# Patient Record
Sex: Male | Born: 1985 | Race: White | Hispanic: No | Marital: Married | Smoking: Current every day smoker
Health system: Southern US, Community
[De-identification: ages and names within clinical notes are randomized; demographics above are authoritative.]

## PROBLEM LIST (undated history)

## (undated) DIAGNOSIS — R569 Unspecified convulsions: Secondary | ICD-10-CM

## (undated) DIAGNOSIS — S02609A Fracture of mandible, unspecified, initial encounter for closed fracture: Secondary | ICD-10-CM

## (undated) DIAGNOSIS — K56609 Unspecified intestinal obstruction, unspecified as to partial versus complete obstruction: Secondary | ICD-10-CM

## (undated) HISTORY — PX: MANDIBLE FRACTURE SURGERY: SHX706

---

## 2003-07-24 ENCOUNTER — Other Ambulatory Visit: Payer: Self-pay

## 2003-10-26 ENCOUNTER — Other Ambulatory Visit: Payer: Self-pay

## 2004-10-30 ENCOUNTER — Emergency Department: Payer: Self-pay | Admitting: Emergency Medicine

## 2005-05-10 ENCOUNTER — Emergency Department: Payer: Self-pay | Admitting: Emergency Medicine

## 2005-05-17 ENCOUNTER — Emergency Department: Payer: Self-pay | Admitting: Unknown Physician Specialty

## 2005-06-07 ENCOUNTER — Emergency Department: Payer: Self-pay | Admitting: Emergency Medicine

## 2006-03-16 ENCOUNTER — Emergency Department: Payer: Self-pay | Admitting: Emergency Medicine

## 2006-03-16 ENCOUNTER — Other Ambulatory Visit: Payer: Self-pay

## 2006-06-27 ENCOUNTER — Emergency Department: Payer: Self-pay | Admitting: Unknown Physician Specialty

## 2006-07-14 ENCOUNTER — Emergency Department: Payer: Self-pay | Admitting: Emergency Medicine

## 2006-09-09 ENCOUNTER — Emergency Department: Payer: Self-pay | Admitting: Emergency Medicine

## 2006-09-21 ENCOUNTER — Emergency Department: Payer: Self-pay | Admitting: Emergency Medicine

## 2006-09-30 ENCOUNTER — Emergency Department: Payer: Self-pay | Admitting: Emergency Medicine

## 2006-10-25 ENCOUNTER — Emergency Department: Payer: Self-pay | Admitting: Emergency Medicine

## 2007-01-23 ENCOUNTER — Emergency Department: Payer: Self-pay | Admitting: Emergency Medicine

## 2007-01-26 ENCOUNTER — Emergency Department: Payer: Self-pay | Admitting: Emergency Medicine

## 2008-01-05 ENCOUNTER — Emergency Department: Payer: Self-pay | Admitting: Emergency Medicine

## 2008-03-14 ENCOUNTER — Emergency Department: Payer: Self-pay | Admitting: Emergency Medicine

## 2008-05-17 ENCOUNTER — Emergency Department: Payer: Self-pay | Admitting: Emergency Medicine

## 2008-06-19 ENCOUNTER — Emergency Department: Payer: Self-pay | Admitting: Emergency Medicine

## 2008-08-21 ENCOUNTER — Emergency Department: Payer: Self-pay | Admitting: Internal Medicine

## 2008-10-08 ENCOUNTER — Emergency Department: Payer: Self-pay | Admitting: Emergency Medicine

## 2009-01-19 ENCOUNTER — Inpatient Hospital Stay: Payer: Self-pay | Admitting: *Deleted

## 2009-05-15 ENCOUNTER — Emergency Department: Payer: Self-pay | Admitting: Emergency Medicine

## 2009-06-15 ENCOUNTER — Emergency Department: Payer: Self-pay | Admitting: Emergency Medicine

## 2009-07-14 ENCOUNTER — Emergency Department: Payer: Self-pay | Admitting: Emergency Medicine

## 2009-10-13 ENCOUNTER — Emergency Department: Payer: Self-pay | Admitting: Emergency Medicine

## 2010-06-04 ENCOUNTER — Emergency Department: Payer: Self-pay | Admitting: Emergency Medicine

## 2010-06-14 ENCOUNTER — Emergency Department: Payer: Self-pay | Admitting: Emergency Medicine

## 2010-07-20 ENCOUNTER — Emergency Department: Payer: Self-pay | Admitting: Internal Medicine

## 2010-07-22 ENCOUNTER — Emergency Department: Payer: Self-pay | Admitting: Emergency Medicine

## 2010-12-07 ENCOUNTER — Emergency Department: Payer: Self-pay | Admitting: Emergency Medicine

## 2010-12-19 ENCOUNTER — Emergency Department: Payer: Self-pay | Admitting: Emergency Medicine

## 2011-02-27 ENCOUNTER — Emergency Department: Payer: Self-pay | Admitting: Emergency Medicine

## 2011-02-27 LAB — RAPID INFLUENZA A&B ANTIGENS

## 2011-03-01 ENCOUNTER — Emergency Department: Payer: Self-pay | Admitting: Emergency Medicine

## 2011-03-27 IMAGING — CR CERVICAL SPINE - COMPLETE 4+ VIEW
1 series · 7 of 7 positions shown · non-contrast
Comparison: None

REASON FOR EXAM: pain
COMMENTS:   May transport without cardiac monitor

PROCEDURE:     DXR - DXR CERVICAL SPINE COMPLETE  - July 14, 2009  [DATE]
RESULT:     History: Pain

[Series 1: view not recorded · 0.17mm/px · 7 of 7 slices shown]
[im 1/7]
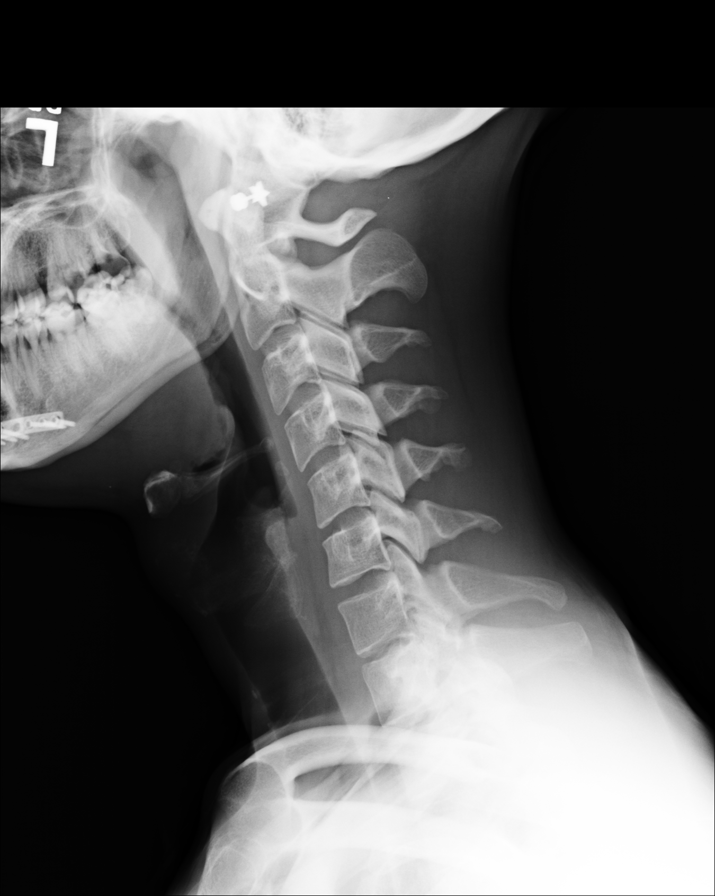
[im 2/7]
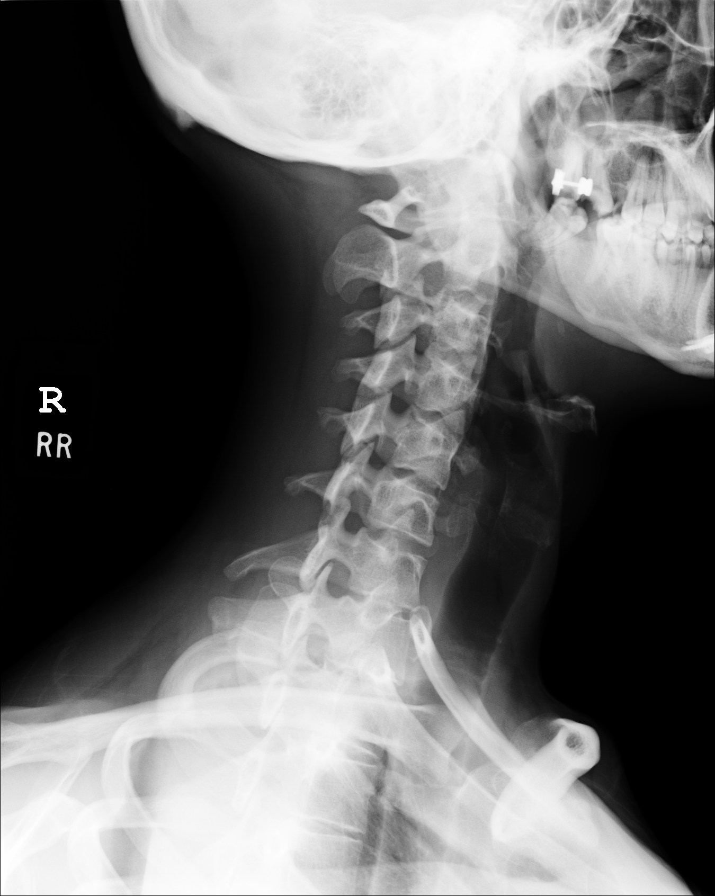
[im 3/7]
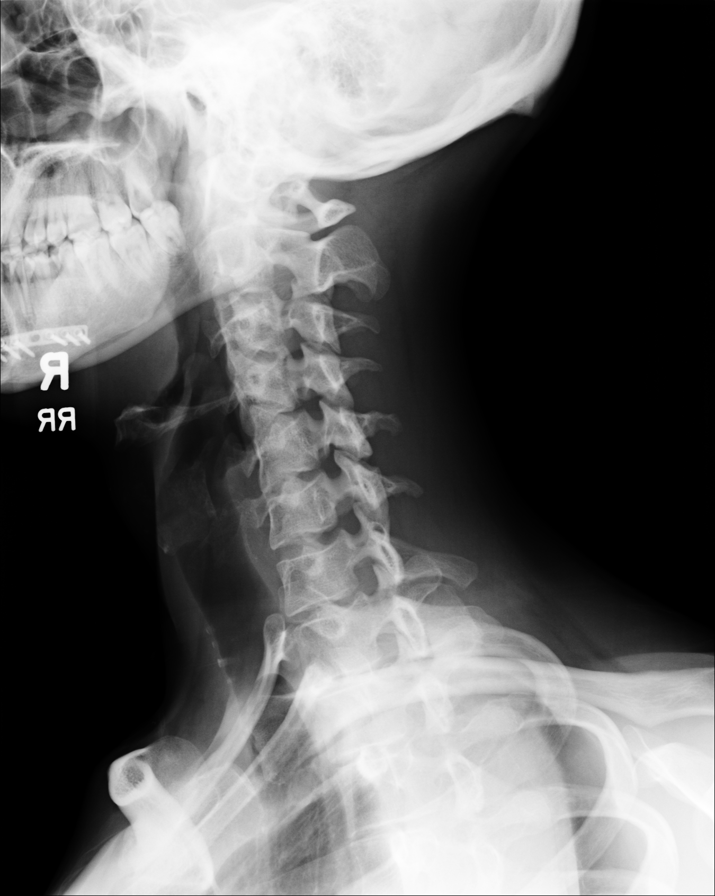
[im 4/7]
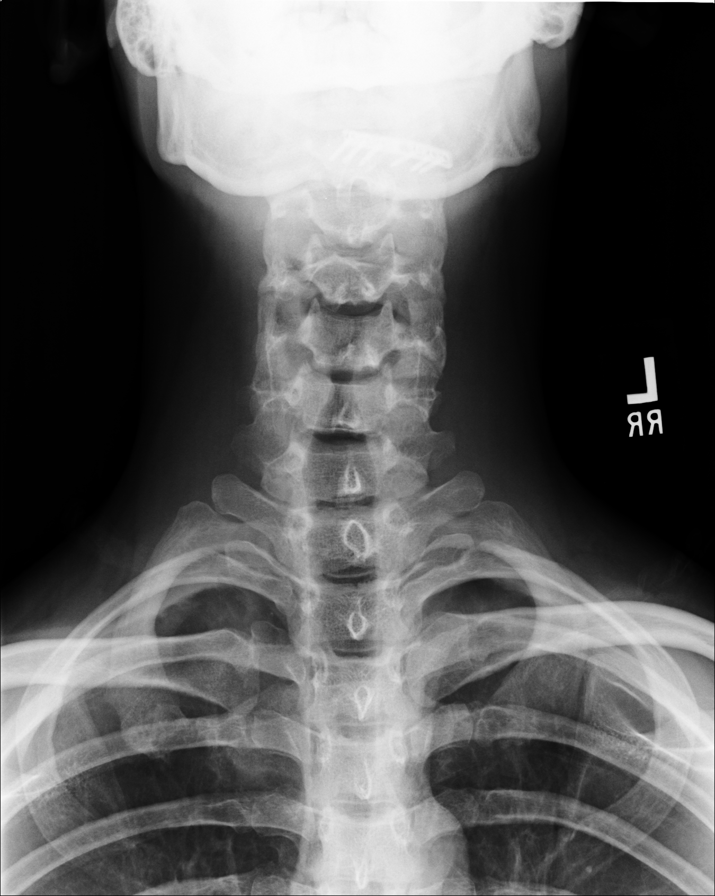
[im 5/7]
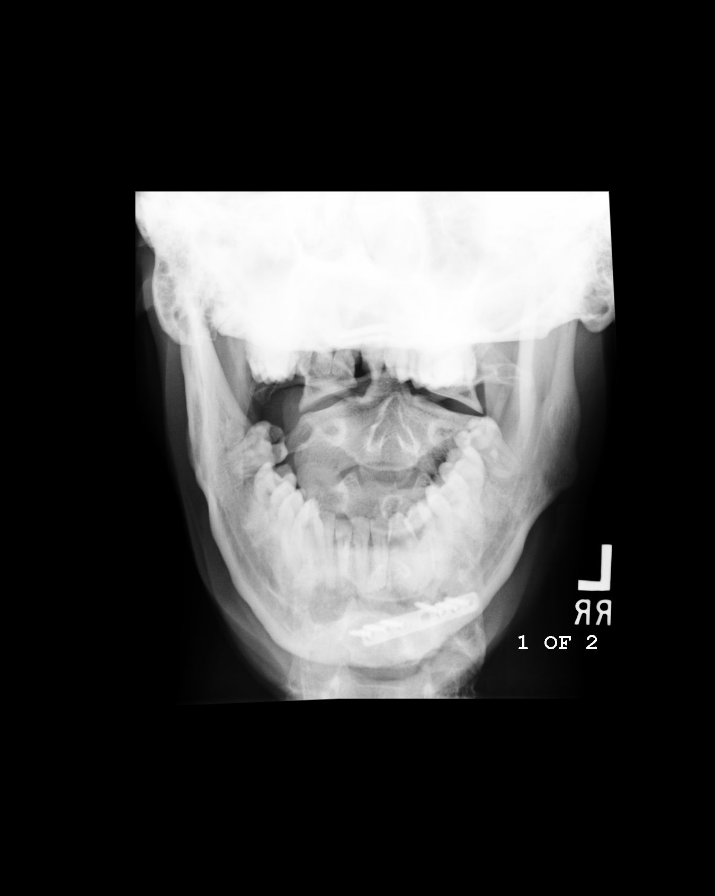
[im 6/7]
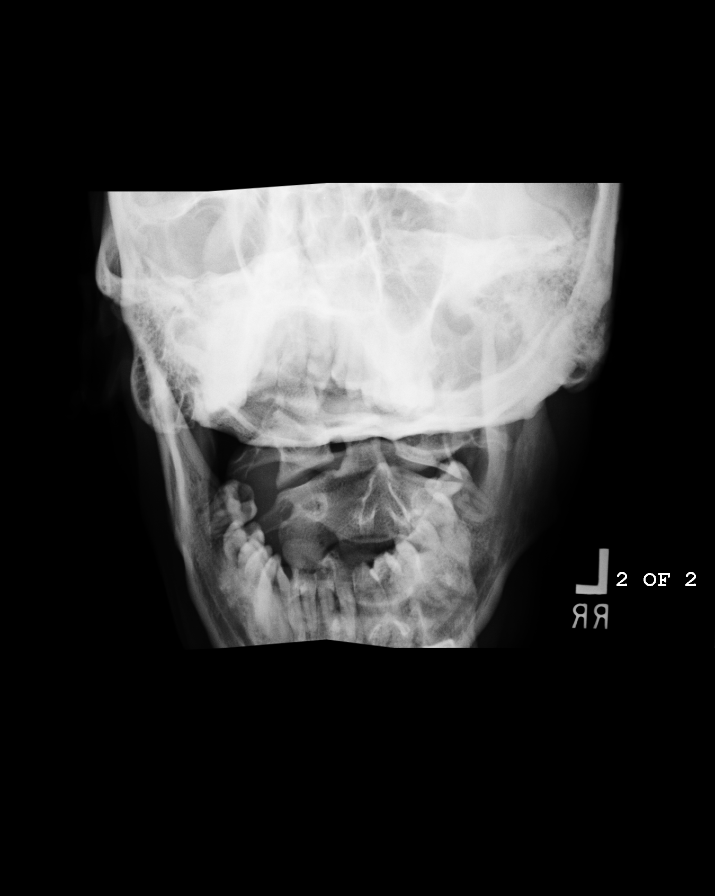
[im 7/7]
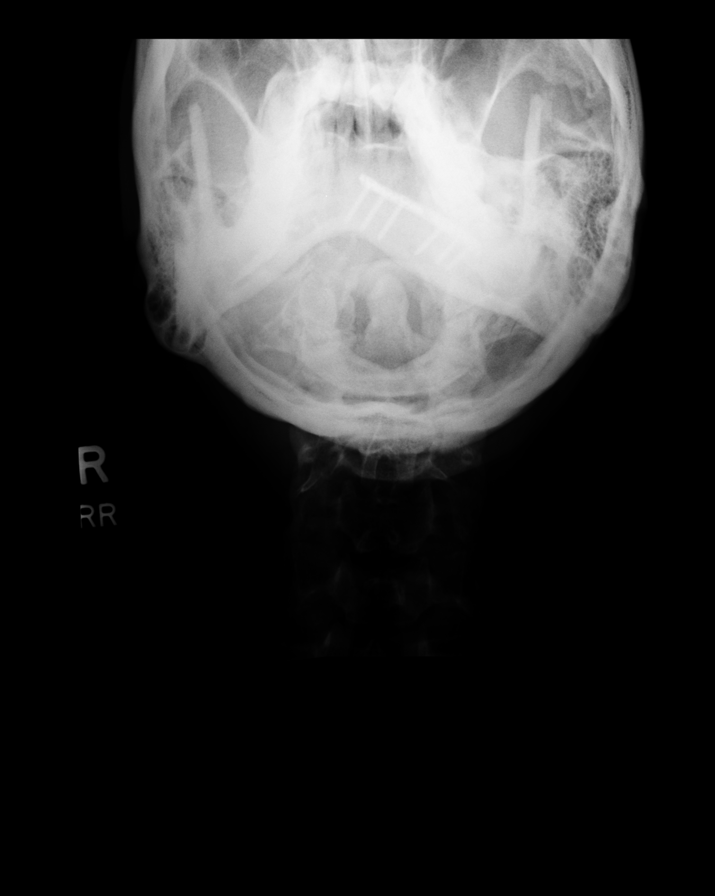

[7 of 7 positions shown; findings below may reference images not displayed]

FINDINGS: AP, lateral, bilateral oblique and odontoid views of the cervical spine are
provided.

The cervical spine is visualized to the level of C7-T1.

The vertebral body heights are maintained. The alignment is normal. The
prevertebral soft tissues are normal. There is no acute fracture or static
listhesis. Bilateral neural foramina are patent.
IMPRESSION: No acute osseous injury of the cervical spine.

## 2011-06-14 ENCOUNTER — Emergency Department: Payer: Self-pay | Admitting: Emergency Medicine

## 2011-12-11 ENCOUNTER — Emergency Department: Payer: Self-pay | Admitting: Emergency Medicine

## 2011-12-19 ENCOUNTER — Emergency Department: Payer: Self-pay | Admitting: Internal Medicine

## 2012-01-21 ENCOUNTER — Emergency Department: Payer: Self-pay | Admitting: Emergency Medicine

## 2012-02-09 ENCOUNTER — Emergency Department: Payer: Self-pay | Admitting: Emergency Medicine

## 2012-02-09 LAB — URINALYSIS, COMPLETE
Ketone: NEGATIVE
Leukocyte Esterase: NEGATIVE
Nitrite: NEGATIVE
Ph: 6 (ref 4.5–8.0)
Protein: NEGATIVE
RBC,UR: 127 /HPF (ref 0–5)
WBC UR: 1 /HPF (ref 0–5)

## 2012-02-09 LAB — COMPREHENSIVE METABOLIC PANEL
Albumin: 3.8 g/dL (ref 3.4–5.0)
Alkaline Phosphatase: 71 U/L (ref 50–136)
Bilirubin,Total: 0.4 mg/dL (ref 0.2–1.0)
Calcium, Total: 8.7 mg/dL (ref 8.5–10.1)
Co2: 26 mmol/L (ref 21–32)
EGFR (Non-African Amer.): >60
Potassium: 4.3 mmol/L (ref 3.5–5.1)
SGOT(AST): 22 U/L (ref 15–37)
SGPT (ALT): 27 U/L (ref 12–78)
Sodium: 140 mmol/L (ref 136–145)
Total Protein: 7 g/dL (ref 6.4–8.2)

## 2012-02-09 LAB — CBC
HCT: 40.2 % (ref 40.0–52.0)
HGB: 13.7 g/dL (ref 13.0–18.0)
MCH: 31.3 pg (ref 26.0–34.0)
MCHC: 34.2 g/dL (ref 32.0–36.0)
MCV: 92 fL (ref 80–100)
Platelet: 253 10*3/uL (ref 150–440)
RBC: 4.39 10*6/uL — ABNORMAL LOW (ref 4.40–5.90)

## 2012-06-19 ENCOUNTER — Emergency Department: Payer: Self-pay | Admitting: Emergency Medicine

## 2012-08-22 ENCOUNTER — Emergency Department: Payer: Self-pay | Admitting: Emergency Medicine

## 2012-09-11 ENCOUNTER — Emergency Department: Payer: Self-pay | Admitting: Internal Medicine

## 2012-11-16 ENCOUNTER — Emergency Department: Payer: Self-pay | Admitting: Internal Medicine

## 2012-12-06 ENCOUNTER — Emergency Department: Payer: Self-pay | Admitting: Emergency Medicine

## 2012-12-06 LAB — URINALYSIS, COMPLETE
Bacteria: NONE SEEN
Blood: NEGATIVE
Glucose,UR: NEGATIVE mg/dL (ref 0–75)
Nitrite: NEGATIVE
Ph: 8 (ref 4.5–8.0)
Specific Gravity: 1.018 (ref 1.003–1.030)
Squamous Epithelial: <1
WBC UR: <1 /HPF (ref 0–5)

## 2013-09-05 ENCOUNTER — Emergency Department: Payer: Self-pay | Admitting: Emergency Medicine

## 2013-09-15 ENCOUNTER — Ambulatory Visit: Payer: Self-pay | Admitting: Surgery

## 2013-09-22 ENCOUNTER — Ambulatory Visit: Payer: Self-pay | Admitting: Surgery

## 2013-09-22 LAB — DRUG SCREEN, URINE
AMPHETAMINES, UR SCREEN: NEGATIVE (ref ?–1000)
BARBITURATES, UR SCREEN: NEGATIVE (ref ?–200)
Benzodiazepine, Ur Scrn: NEGATIVE (ref ?–200)
Cannabinoid 50 Ng, Ur ~~LOC~~: POSITIVE (ref ?–50)
Cocaine Metabolite,Ur ~~LOC~~: POSITIVE (ref ?–300)
MDMA (Ecstasy)Ur Screen: NEGATIVE (ref ?–500)
Methadone, Ur Screen: NEGATIVE (ref ?–300)
Opiate, Ur Screen: POSITIVE (ref ?–300)
Phencyclidine (PCP) Ur S: NEGATIVE (ref ?–25)
Tricyclic, Ur Screen: NEGATIVE (ref ?–1000)

## 2014-01-20 ENCOUNTER — Emergency Department: Payer: Self-pay | Admitting: Student

## 2014-02-02 ENCOUNTER — Emergency Department: Payer: Self-pay | Admitting: Student

## 2014-02-22 LAB — COMPREHENSIVE METABOLIC PANEL
Albumin: 3.9 g/dL (ref 3.4–5.0)
Alkaline Phosphatase: 54 U/L
Anion Gap: 6 — ABNORMAL LOW (ref 7–16)
BILIRUBIN TOTAL: 0.3 mg/dL (ref 0.2–1.0)
BUN: 13 mg/dL (ref 7–18)
Calcium, Total: 8.9 mg/dL (ref 8.5–10.1)
Chloride: 109 mmol/L — ABNORMAL HIGH (ref 98–107)
Co2: 24 mmol/L (ref 21–32)
Creatinine: 1.11 mg/dL (ref 0.60–1.30)
EGFR (African American): >60
Glucose: 97 mg/dL (ref 65–99)
OSMOLALITY: 278 (ref 275–301)
Potassium: 3.9 mmol/L (ref 3.5–5.1)
SGOT(AST): 19 U/L (ref 15–37)
SGPT (ALT): 25 U/L
Sodium: 139 mmol/L (ref 136–145)
Total Protein: 6.9 g/dL (ref 6.4–8.2)

## 2014-02-22 LAB — DRUG SCREEN, URINE
Amphetamines, Ur Screen: NEGATIVE (ref ?–1000)
BARBITURATES, UR SCREEN: NEGATIVE (ref ?–200)
BENZODIAZEPINE, UR SCRN: POSITIVE (ref ?–200)
Cannabinoid 50 Ng, Ur ~~LOC~~: POSITIVE (ref ?–50)
Cocaine Metabolite,Ur ~~LOC~~: POSITIVE (ref ?–300)
MDMA (Ecstasy)Ur Screen: NEGATIVE (ref ?–500)
Methadone, Ur Screen: NEGATIVE (ref ?–300)
Opiate, Ur Screen: POSITIVE (ref ?–300)
Phencyclidine (PCP) Ur S: NEGATIVE (ref ?–25)
Tricyclic, Ur Screen: NEGATIVE (ref ?–1000)

## 2014-02-22 LAB — URINALYSIS, COMPLETE
BACTERIA: NONE SEEN
BLOOD: NEGATIVE
Bilirubin,UR: NEGATIVE
Glucose,UR: NEGATIVE mg/dL (ref 0–75)
KETONE: NEGATIVE
LEUKOCYTE ESTERASE: NEGATIVE
NITRITE: NEGATIVE
PH: 5 (ref 4.5–8.0)
Protein: 30
RBC,UR: <1 /HPF (ref 0–5)
Specific Gravity: 1.02 (ref 1.003–1.030)
Squamous Epithelial: NONE SEEN
WBC UR: <1 /HPF (ref 0–5)

## 2014-02-22 LAB — CBC
HCT: 42 % (ref 40.0–52.0)
HGB: 13.8 g/dL (ref 13.0–18.0)
MCH: 30.4 pg (ref 26.0–34.0)
MCHC: 32.8 g/dL (ref 32.0–36.0)
MCV: 93 fL (ref 80–100)
Platelet: 258 10*3/uL (ref 150–440)
RBC: 4.54 10*6/uL (ref 4.40–5.90)
RDW: 13.5 % (ref 11.5–14.5)
WBC: 8 10*3/uL (ref 3.8–10.6)

## 2014-02-22 LAB — SALICYLATE LEVEL: Salicylates, Serum: 4.1 mg/dL — ABNORMAL HIGH

## 2014-02-22 LAB — ACETAMINOPHEN LEVEL

## 2014-02-22 LAB — ETHANOL

## 2014-02-23 ENCOUNTER — Inpatient Hospital Stay: Payer: Self-pay | Admitting: Psychiatry

## 2014-06-13 ENCOUNTER — Emergency Department
Admit: 2014-06-13 | Disposition: A | Discharge status: Home / Self Care | Payer: Self-pay | Admitting: Emergency Medicine

## 2014-06-17 NOTE — Consult Note (Signed)
Brief Consult Note: Diagnosis: major depression.   Patient was seen by consultant.   Consult note dictated.   Orders entered.   Comments: Psychiatry: PAtient reports severe depression and suicidal ideation. Multi drug abuse . [facility] PRN detox meds.  Electronic Signatures: Clapacs, Jackquline DenmarkJohn T (MD)  (Signed 30-Dec-15 17:53)  Authored: Brief Consult Note   Last Updated: 30-Dec-15 17:53 by Audery Amellapacs, John T (MD)
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
## 2014-07-27 ENCOUNTER — Emergency Department: Payer: Medicaid Other

## 2014-07-27 ENCOUNTER — Emergency Department
Admission: EM | Admit: 2014-07-27 | Discharge: 2014-07-27 | Disposition: A | Discharge status: Home / Self Care | Payer: Medicaid Other | Attending: Emergency Medicine | Admitting: Emergency Medicine

## 2014-07-27 DIAGNOSIS — Y998 Other external cause status: Secondary | ICD-10-CM | POA: Diagnosis not present

## 2014-07-27 DIAGNOSIS — S0181XA Laceration without foreign body of other part of head, initial encounter: Secondary | ICD-10-CM | POA: Insufficient documentation

## 2014-07-27 DIAGNOSIS — W1809XA Striking against other object with subsequent fall, initial encounter: Secondary | ICD-10-CM | POA: Diagnosis not present

## 2014-07-27 DIAGNOSIS — S0083XA Contusion of other part of head, initial encounter: Secondary | ICD-10-CM

## 2014-07-27 DIAGNOSIS — Y9289 Other specified places as the place of occurrence of the external cause: Secondary | ICD-10-CM | POA: Diagnosis not present

## 2014-07-27 DIAGNOSIS — Y9389 Activity, other specified: Secondary | ICD-10-CM | POA: Insufficient documentation

## 2014-07-27 DIAGNOSIS — S0993XA Unspecified injury of face, initial encounter: Secondary | ICD-10-CM | POA: Diagnosis present

## 2014-07-27 MED ORDER — SULFAMETHOXAZOLE-TRIMETHOPRIM 800-160 MG PO TABS
ORAL_TABLET | ORAL | Status: AC
Start: 2014-07-27 — End: 2014-07-27
  Administered 2014-07-27: 1 via ORAL
  Filled 2014-07-27: qty 1

## 2014-07-27 MED ORDER — IBUPROFEN 800 MG PO TABS: 800.0000 mg | ORAL_TABLET | Freq: Three times a day (TID) | ORAL | Status: DC | PRN

## 2014-07-27 MED ORDER — LIDOCAINE HCL (PF) 1 % IJ SOLN
INTRAMUSCULAR | Status: AC
  Filled 2014-07-27: qty 5

## 2014-07-27 MED ORDER — SULFAMETHOXAZOLE-TRIMETHOPRIM 800-160 MG PO TABS: 1.0000 | ORAL_TABLET | Freq: Two times a day (BID) | ORAL | Status: DC

## 2014-07-27 MED ORDER — HYDROMORPHONE HCL 1 MG/ML IJ SOLN
1.0000 mg | Freq: Once | INTRAMUSCULAR | Status: AC
  Administered 2014-07-27: 1 mg via INTRAMUSCULAR

## 2014-07-27 MED ORDER — SULFAMETHOXAZOLE-TRIMETHOPRIM 800-160 MG PO TABS
1.0000 | ORAL_TABLET | Freq: Once | ORAL | Status: AC
  Administered 2014-07-27: 1 via ORAL

## 2014-07-27 MED ORDER — TRAMADOL HCL 50 MG PO TABS: 50.0000 mg | ORAL_TABLET | Freq: Four times a day (QID) | ORAL | Status: DC | PRN

## 2014-07-27 MED ORDER — HYDROMORPHONE HCL 1 MG/ML IJ SOLN
INTRAMUSCULAR | Status: AC
  Administered 2014-07-27: 1 mg via INTRAMUSCULAR
  Filled 2014-07-27: qty 1
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
## 2015-04-01 ENCOUNTER — Encounter: Payer: Self-pay | Admitting: Emergency Medicine

## 2015-04-01 ENCOUNTER — Emergency Department
Admission: EM | Admit: 2015-04-01 | Discharge: 2015-04-01 | Disposition: A | Discharge status: Home / Self Care | Payer: Medicaid Other | Attending: Emergency Medicine | Admitting: Emergency Medicine

## 2015-04-01 DIAGNOSIS — F1210 Cannabis abuse, uncomplicated: Secondary | ICD-10-CM | POA: Diagnosis not present

## 2015-04-01 DIAGNOSIS — K4090 Unilateral inguinal hernia, without obstruction or gangrene, not specified as recurrent: Secondary | ICD-10-CM | POA: Insufficient documentation

## 2015-04-01 DIAGNOSIS — F17210 Nicotine dependence, cigarettes, uncomplicated: Secondary | ICD-10-CM | POA: Diagnosis not present

## 2015-04-01 DIAGNOSIS — F1410 Cocaine abuse, uncomplicated: Secondary | ICD-10-CM | POA: Insufficient documentation

## 2015-04-01 DIAGNOSIS — F1110 Opioid abuse, uncomplicated: Secondary | ICD-10-CM | POA: Insufficient documentation

## 2015-04-01 DIAGNOSIS — Z792 Long term (current) use of antibiotics: Secondary | ICD-10-CM | POA: Diagnosis not present

## 2015-04-01 DIAGNOSIS — F419 Anxiety disorder, unspecified: Secondary | ICD-10-CM | POA: Diagnosis not present

## 2015-04-01 HISTORY — DX: Unspecified intestinal obstruction, unspecified as to partial versus complete obstruction: K56.609

## 2015-04-01 HISTORY — DX: Unspecified convulsions: R56.9

## 2015-04-01 HISTORY — DX: Fracture of mandible, unspecified, initial encounter for closed fracture: S02.609A

## 2015-04-01 LAB — URINALYSIS COMPLETE WITH MICROSCOPIC (ARMC ONLY)
BILIRUBIN URINE: NEGATIVE
Glucose, UA: NEGATIVE mg/dL
HGB URINE DIPSTICK: NEGATIVE
Ketones, ur: NEGATIVE mg/dL
LEUKOCYTES UA: NEGATIVE
Nitrite: NEGATIVE
Protein, ur: NEGATIVE mg/dL
SPECIFIC GRAVITY, URINE: 1.014 (ref 1.005–1.030)
Squamous Epithelial / LPF: NONE SEEN
pH: 8 (ref 5.0–8.0)

## 2015-04-01 LAB — COMPREHENSIVE METABOLIC PANEL
ALBUMIN: 3.3 g/dL — AB (ref 3.5–5.0)
ALK PHOS: 35 U/L — AB (ref 38–126)
ALT: 19 U/L (ref 17–63)
ANION GAP: 8 (ref 5–15)
AST: 21 U/L (ref 15–41)
BILIRUBIN TOTAL: 0.3 mg/dL (ref 0.3–1.2)
BUN: 18 mg/dL (ref 6–20)
CALCIUM: 8.4 mg/dL — AB (ref 8.9–10.3)
CO2: 23 mmol/L (ref 22–32)
CREATININE: 1.43 mg/dL — AB (ref 0.61–1.24)
Chloride: 107 mmol/L (ref 101–111)
GFR calc non Af Amer: >60 mL/min (ref >60)
GLUCOSE: 92 mg/dL (ref 65–99)
Potassium: 4.3 mmol/L (ref 3.5–5.1)
Sodium: 138 mmol/L (ref 135–145)
Total Protein: 5.6 g/dL — ABNORMAL LOW (ref 6.5–8.1)

## 2015-04-01 LAB — CBC WITH DIFFERENTIAL/PLATELET
BASOS PCT: 1 %
Basophils Absolute: 0 10*3/uL (ref 0–0.1)
EOS ABS: 0.3 10*3/uL (ref 0–0.7)
EOS PCT: 4 %
HCT: 40.9 % (ref 40.0–52.0)
HEMOGLOBIN: 13.3 g/dL (ref 13.0–18.0)
LYMPHS ABS: 2 10*3/uL (ref 1.0–3.6)
Lymphocytes Relative: 27 %
MCH: 29.9 pg (ref 26.0–34.0)
MCHC: 32.6 g/dL (ref 32.0–36.0)
MCV: 91.7 fL (ref 80.0–100.0)
Monocytes Absolute: 0.7 10*3/uL (ref 0.2–1.0)
Monocytes Relative: 9 %
NEUTROS PCT: 59 %
Neutro Abs: 4.6 10*3/uL (ref 1.4–6.5)
PLATELETS: 224 10*3/uL (ref 150–440)
RBC: 4.46 MIL/uL (ref 4.40–5.90)
RDW: 13.7 % (ref 11.5–14.5)
WBC: 7.6 10*3/uL (ref 3.8–10.6)

## 2015-04-01 LAB — URINE DRUG SCREEN, QUALITATIVE (ARMC ONLY)
Amphetamines, Ur Screen: NOT DETECTED
BENZODIAZEPINE, UR SCRN: NOT DETECTED
Barbiturates, Ur Screen: NOT DETECTED
CANNABINOID 50 NG, UR ~~LOC~~: POSITIVE — AB
Cocaine Metabolite,Ur ~~LOC~~: POSITIVE — AB
MDMA (Ecstasy)Ur Screen: NOT DETECTED
METHADONE SCREEN, URINE: NOT DETECTED
Opiate, Ur Screen: POSITIVE — AB
Phencyclidine (PCP) Ur S: NOT DETECTED
TRICYCLIC, UR SCREEN: NOT DETECTED

## 2015-04-01 MED ORDER — DIPHENHYDRAMINE HCL 50 MG/ML IJ SOLN
12.5000 mg | Freq: Once | INTRAMUSCULAR | Status: AC
  Administered 2015-04-01: 12.5 mg via INTRAVENOUS

## 2015-04-01 MED ORDER — DIPHENHYDRAMINE HCL 50 MG/ML IJ SOLN
INTRAMUSCULAR | Status: AC
  Filled 2015-04-01: qty 1

## 2015-04-01 MED ORDER — MORPHINE SULFATE (PF) 4 MG/ML IV SOLN
4.0000 mg | Freq: Once | INTRAVENOUS | Status: AC
  Administered 2015-04-01: 4 mg via INTRAVENOUS
  Filled 2015-04-01: qty 1

## 2015-04-01 MED ORDER — DOCUSATE SODIUM 100 MG PO CAPS: 100.0000 mg | ORAL_CAPSULE | Freq: Every day | ORAL | Status: AC | PRN

## 2015-04-01 NOTE — ED Notes (Signed)
Pt states he has had an inguinal hernia on the R side. Pt states for the last 2 months the hernia has been hurting him, and the last 3 weeks pain has gotten worse. Pt states pain has gone to his testicles and under his buttocks and up into his back.

## 2015-04-01 NOTE — ED Provider Notes (Addendum)
[facility] Emergency Department Provider Note  ____________________________________________   I have reviewed the triage vital signs and the nursing notes.   HISTORY  Chief Complaint Inguinal Hernia    HPI Dakota Clements is a 30 y.o. male presents today complaining of his inguinal hernia hurting. Patient has had this hernia he states for 2-3 years at least. He has been seen by multiple doctors for including a urologist. States when he stands up it comes out and it hurts but then when he lies down because back in. He is able to make that happen at this moment. Does not appear to be incarcerated. 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 He has not had surgery on it he states. At this time, he is complaining of pain from the hernia seems to reflect down towards his testicles. This has happened to her multiple times before and has happened actually on a daily basis for 2-3 years. He has had no difficulty stooling no nausea no vomiting no melena bright red blood per rectum no dysuria no urinary frequency  Past Medical History  Diagnosis Date  . Small bowel obstruction (HCC)     When he was a baby  . Broken jaw (HCC)   . Seizures (HCC)     There are no active problems to display for this patient.   Past Surgical History  Procedure Laterality Date  . Mandible fracture surgery      Current Outpatient Rx  Name  Route  Sig  Dispense  Refill  . ibuprofen (ADVIL,MOTRIN) 800 MG tablet   Oral   Take 1 tablet (800 mg total) by mouth every 8 (eight) hours as needed for moderate pain.   15 tablet   0   . sulfamethoxazole-trimethoprim (BACTRIM DS,SEPTRA DS) 800-160 MG per tablet   Oral   Take 1 tablet by mouth 2 (two) times daily.   20 tablet   0   . traMADol (ULTRAM) 50 MG tablet   Oral   Take 1 tablet (50 mg total) by mouth every 6 (six) hours as needed for moderate pain.    12 tablet   0     Allergies Dilantin  History reviewed. No pertinent family history.  Social History Social History  Substance Use Topics  . Smoking status: Current Every Day Smoker -- 0.50 packs/day    Types: Cigarettes  . Smokeless tobacco: Never Used  . Alcohol Use: Yes     Comment: Socially    Review of Systems Constitutional: No fever/chills Eyes: No visual changes. ENT: No sore throat. No stiff neck no neck pain Cardiovascular: Denies chest pain. Respiratory: Denies shortness of breath. Gastrointestinal:   no vomiting.  No diarrhea.  No constipation. Genitourinary: Negative for dysuria. Musculoskeletal: Negative lower extremity swelling Skin: Negative for rash. Neurological: Negative for headaches, focal weakness or numbness. 10-point ROS otherwise negative.  ____________________________________________   PHYSICAL EXAM:  VITAL SIGNS: ED Triage Vitals  Enc Vitals Group     BP 04/01/15 1742 128/77 mmHg     Pulse Rate 04/01/15 1742 59     Resp 04/01/15 1742 18     Temp 04/01/15 1742 97.6 F (36.4 C)     Temp Source 04/01/15 1742 Oral     SpO2 04/01/15 1742 99 %     Weight 04/01/15 1742 136 lb (61.689 kg)     Height 04/01/15 1742  (1.803 m)  Head Cir --      Peak Flow --      Pain Score 04/01/15 1742 10     Pain Loc --      Pain Edu? --      Excl. in GC? --     Constitutional: Alert and oriented. Well appearing and in no acute distress. Eyes: Conjunctivae are normal. PERRL. EOMI. Head: Atraumatic. Nose: No congestion/rhinnorhea. Mouth/Throat: Mucous membranes are moist.  Oropharynx non-erythematous. Very poor dentition Neck: No stridor.   Nontender with no meningismus Cardiovascular: Normal rate, regular rhythm. Grossly normal heart sounds.  Good peripheral circulation. Respiratory: Normal respiratory effort.  No retractions. Lungs CTAB. Abdominal: Soft and nontender. No distention. No guarding no rebound Back:  There is no focal  tenderness or step off there is no midline tenderness there are no lesions noted. there is no CVA tenderness When patient is lying down there is no evidence of herniation, he has minimal tenderness to palpation of the inguinal canal on the right. However, when he stands up there is a bulge at the proximal edges of the inguinal canal which is easily reducible by changing position. There is no erythema there is no true testicular pain or swelling, the pain is in the inguinal canal itself. There is no testicular mass or scrotal abdomen mildly's. There is no penile lesions or abnormalities noted. There is no induration or evidence of abscess. His abdomen is otherwise benign.  Musculoskeletal: No lower extremity tenderness. No joint effusions, no DVT signs strong distal pulses no edema Neurologic:  Normal speech and language. No gross focal neurologic deficits are appreciated.  Skin:  Skin is warm, dry and intact. No rash noted. Psychiatric: Mood and affect are anxious and upset. Speech and behavior are normal.  ____________________________________________   LABS (all labs ordered are listed, but only abnormal results are displayed)  Labs Reviewed  COMPREHENSIVE METABOLIC PANEL - Abnormal; Notable for the following:    Creatinine, Ser 1.43 (*)    Calcium 8.4 (*)    Total Protein 5.6 (*)    Albumin 3.3 (*)    Alkaline Phosphatase 35 (*)    All other components within normal limits  CBC WITH DIFFERENTIAL/PLATELET  URINE DRUG SCREEN, QUALITATIVE ([facility] ONLY)  URINALYSIS COMPLETEWITH MICROSCOPIC ([facility] ONLY)   ____________________________________________  EKG  I personally interpreted any EKGs ordered by me or triage  ____________________________________________  RADIOLOGY  I reviewed any imaging ordered by me or triage that were performed during my shift ____________________________________________   PROCEDURES  Procedure(s) performed: None  Critical Care performed:  None  ____________________________________________   INITIAL IMPRESSION / ASSESSMENT AND PLAN / ED COURSE  Pertinent labs & imaging results that were available during my care of the patient were reviewed by me and considered in my medical decision making (see chart for details).  Patient with inguinal pain reference to a very easily reducible hernia with no evidence of testicular pathology. Patient was quite angry and upset. He states he does have a ride home he is requesting pain medication. We will give him pain medication. This is a chronic problem I do not see any evidence today of an acute incarceration or something that will require acute surgical intervention. This has been going on for 3 years. I will advise stool softeners, and control, avoiding heavy lifting and close follow-up with surgery. We'll check basic blood work and urine as a precaution. Given the obvious reducibility of the hernia do not think imaging is needed at this time.  -----------------------------------------  7:26 PM on 04/01/2015 -----------------------------------------  Patient felt itchy after morphine which was likely a nonallergic reaction. Had no hives noted throat swelling or tongue swelling no wheeze. We did give Benadryl he felt much better.  ----------------------------------------- 8:02 PM on 04/01/2015 -----------------------------------------  Patient much more calm at this time, no evidence of atrophy intra-abdominal pathology. He is watching television no acute distress. He does sign noted have cocaine on board which I suspect may be why he was so anxious when he first arrived. No evidence of allergic reaction. No evidence of incarcerated hernia. Abdomen is benign. We'll give him stool softeners and recommended outpatient follow-up. Return precautions given and understood ____________________________________________   FINAL CLINICAL IMPRESSION(S) / ED DIAGNOSES  Final diagnoses:  None       This chart was dictated using voice recognition software.  Despite best efforts to proofread,  errors can occur which can change meaning.     Jeanmarie Plant, MD 04/01/15 1914  Jeanmarie Plant, MD 04/01/15 8119  Jeanmarie Plant, MD 04/01/15 2003

## 2015-04-01 NOTE — Discharge Instructions (Signed)

## 2015-04-01 NOTE — ED Notes (Signed)
Pt c/o itching after morphine.  Redness noted above site but pt has been scratching; unsure if from medication or scratching. Dr Alphonzo Lemmings notified.

## 2015-06-23 ENCOUNTER — Emergency Department: Payer: Medicaid Other

## 2015-06-23 ENCOUNTER — Emergency Department
Admission: EM | Admit: 2015-06-23 | Discharge: 2015-06-23 | Disposition: A | Discharge status: Home / Self Care | Payer: Medicaid Other | Attending: Emergency Medicine | Admitting: Emergency Medicine

## 2015-06-23 ENCOUNTER — Encounter: Payer: Self-pay | Admitting: Emergency Medicine

## 2015-06-23 DIAGNOSIS — Y999 Unspecified external cause status: Secondary | ICD-10-CM | POA: Insufficient documentation

## 2015-06-23 DIAGNOSIS — F17210 Nicotine dependence, cigarettes, uncomplicated: Secondary | ICD-10-CM | POA: Diagnosis not present

## 2015-06-23 DIAGNOSIS — W208XXA Other cause of strike by thrown, projected or falling object, initial encounter: Secondary | ICD-10-CM | POA: Insufficient documentation

## 2015-06-23 DIAGNOSIS — Y939 Activity, unspecified: Secondary | ICD-10-CM | POA: Diagnosis not present

## 2015-06-23 DIAGNOSIS — S99921A Unspecified injury of right foot, initial encounter: Secondary | ICD-10-CM | POA: Diagnosis present

## 2015-06-23 DIAGNOSIS — S9031XA Contusion of right foot, initial encounter: Secondary | ICD-10-CM | POA: Diagnosis not present

## 2015-06-23 DIAGNOSIS — S90121A Contusion of right lesser toe(s) without damage to nail, initial encounter: Secondary | ICD-10-CM | POA: Diagnosis not present

## 2015-06-23 DIAGNOSIS — Z8673 Personal history of transient ischemic attack (TIA), and cerebral infarction without residual deficits: Secondary | ICD-10-CM | POA: Diagnosis not present

## 2015-06-23 DIAGNOSIS — Y929 Unspecified place or not applicable: Secondary | ICD-10-CM | POA: Diagnosis not present

## 2015-06-23 MED ORDER — KETOROLAC TROMETHAMINE 30 MG/ML IJ SOLN
30.0000 mg | Freq: Once | INTRAMUSCULAR | Status: AC
  Administered 2015-06-23: 30 mg via INTRAMUSCULAR
  Filled 2015-06-23: qty 1

## 2015-06-23 NOTE — ED Provider Notes (Signed)
[facility] Emergency Department Provider Note  ____________________________________________  Time seen: Approximately 1:58 PM  I have reviewed the triage vital signs and the nursing notes.   HISTORY  Chief Complaint Foot Pain    HPI Dakota Clements is a 30 y.o. male , NAD, presents emergency department with right foot and toe pain with bruising for the last 2 days. States 2 nights ago while working, a trailer rolled backwards and rolled over his foot. States there was 800 pounds of equipment on the trailer when it began to fall. Patient was wearing steel toed boots when the injury occurred. Has been taking ibuprofen which has decreased the swelling significantly but is not particularly helping with pain. Denies numbness, weakness, tingling. Has been able to ambulate but with pain. No open wounds, lacerations have been noted.   Past Medical History  Diagnosis Date  . Small bowel obstruction (HCC)     When he was a baby  . Broken jaw (HCC)   . Seizures (HCC)     There are no active problems to display for this patient.   Past Surgical History  Procedure Laterality Date  . Mandible fracture surgery      Current Outpatient Rx  Name  Route  Sig  Dispense  Refill  . docusate sodium (COLACE) 100 MG capsule   Oral   Take 1 capsule (100 mg total) by mouth daily as needed.   30 capsule   2   . ibuprofen (ADVIL,MOTRIN) 800 MG tablet   Oral   Take 1 tablet (800 mg total) by mouth every 8 (eight) hours as needed for moderate pain.   15 tablet   0   . sulfamethoxazole-trimethoprim (BACTRIM DS,SEPTRA DS) 800-160 MG per tablet   Oral   Take 1 tablet by mouth 2 (two) times daily.   20 tablet   0   . traMADol (ULTRAM) 50 MG tablet   Oral   Take 1 tablet (50 mg total) by mouth every 6 (six) hours as needed for moderate pain.   12 tablet   0     Allergies Dilantin  History reviewed. No pertinent family history.  Social History Social  History  Substance Use Topics  . Smoking status: Current Every Day Smoker -- 0.50 packs/day    Types: Cigarettes  . Smokeless tobacco: Never Used  . Alcohol Use: Yes     Comment: Socially     Review of Systems  Constitutional: No fever/chills or fatigue Cardiovascular: No chest pain. Respiratory:  No shortness of breath.  Musculoskeletal: Positive pain about right ankle, foot, toes. Skin: Positive bruising, swelling about right foot and toes. Negative for rash him a open wounds, lacerations. Neurological: Negative for headaches, focal weakness or numbness. No tingling 10-point ROS otherwise negative.  ____________________________________________   PHYSICAL EXAM:  VITAL SIGNS: ED Triage Vitals  Enc Vitals Group     BP 06/23/15 1257 111/72 mmHg     Pulse Rate 06/23/15 1257 58     Resp 06/23/15 1257 18     Temp 06/23/15 1257 98.1 F (36.7 C)     Temp Source 06/23/15 1257 Oral     SpO2 06/23/15 1257 98 %     Weight 06/23/15 1257 139 lb (63.05 kg)     Height 06/23/15 1257  (1.803 m)     Head Cir --      Peak Flow --      Pain Score 06/23/15 1255 8     Pain  Loc --      Pain Edu? --      Excl. in GC? --      Constitutional: Alert and oriented. Well appearing and in no acute distress. Eyes: Conjunctivae are normal.  Head: Atraumatic. Cardiovascular: Good peripheral circulation about the right lower extremity with 2+ pulses noted. Capillary refill less than 3 seconds in all digits of the right foot. Respiratory: Normal respiratory effort without tachypnea or retractions.  Musculoskeletal: Tender to palpation about the distal, dorsal right foot as well as diffusely about the right second, third, fourth toes. Full range of motion of the right ankle, foot and toes but with some pain. No lower extremity edema.  No joint effusions. Neurologic:  Normal speech and language. No gross focal neurologic deficits are appreciated. Sensation is grossly intact of the right lower  extremity. Skin:  Diffuse blue ecchymosis is noted about the second, third, fourth toes as well as the distal portion of the right foot. No open wounds or lesions. No significant swelling is noted. Skin is warm, dry and intact. No rash noted. Psychiatric: Mood and affect are normal. Speech and behavior are normal. Patient exhibits appropriate insight and judgement.   ____________________________________________   LABS  None ____________________________________________  EKG  None ____________________________________________  RADIOLOGY I have personally viewed and evaluated these images (plain radiographs) as part of my medical decision making, as well as reviewing the written report by the radiologist.  Dg Foot Complete Right  06/23/2015  CLINICAL DATA:  Injury to right foot 2 days ago, bruising lateral side. EXAM: RIGHT FOOT COMPLETE - 3+ VIEW COMPARISON:  Right foot plain films dated 05/15/2009. FINDINGS: Osseous alignment is normal. No fracture line or displaced fracture fragment seen. Overall bone mineralization is normal. Soft tissues about the right foot are unremarkable. IMPRESSION: Negative. Electronically Signed   By: Bary RichardStan  Maynard M.D.   On: 06/23/2015 13:45    ____________________________________________    PROCEDURES  Procedure(s) performed: None    Medications  ketorolac (TORADOL) 30 MG/ML injection 30 mg (30 mg Intramuscular Given 06/23/15 1500)     ____________________________________________   INITIAL IMPRESSION / ASSESSMENT AND PLAN / ED COURSE  Pertinent imaging results that were available during my care of the patient were reviewed by me and considered in my medical decision making (see chart for details).  Patient's diagnosis is consistent with contusion of right foot including toes. Patient was placed in a cast shoe and given crutches to assist with ambulation. Patient will be discharged home with instructions to continue to take ibuprofen as needed  for pain. Patient advised to apply ice to the affected area 20 minutes 3-4 times daily and keep foot elevated when not ambulating. [facility] or his primary care provider if symptoms persist past this treatment course. Patient is given ED precautions to return to the ED for any worsening or new symptoms.    ----------------------------------------- 2:20 PM on 06/23/2015 -----------------------------------------  At discharge the patient's blood pressure was noted to be lower patient noted to be fatigued. We have given him water and will recheck his blood pressure in approximately 30 minutes. Patient has had no other symptoms of dehydration or hypotension during his course in the emergency department.   ----------------------------------------- 3:01 PM on 06/23/2015 -----------------------------------------  Patient's blood pressure has now returned to his baseline. No evidence of hypertension at this time. He is ready for discharge. ____________________________________________  FINAL CLINICAL IMPRESSION(S) / ED DIAGNOSES  Final diagnoses:  Contusion  of right foot including toes, initial encounter      NEW MEDICATIONS STARTED DURING THIS VISIT:  New Prescriptions   No medications on file         Hope Pigeon, PA-C 06/23/15 1529  Minna Antis, MD 06/24/15 [id number]

## 2015-06-23 NOTE — Discharge Instructions (Signed)
Foot Contusion A foot contusion is a deep bruise to the foot. Contusions are the result of an injury that caused bleeding under the skin. The contusion may turn blue, purple, or yellow. Minor injuries will give you a painless contusion, but more severe contusions may stay painful and swollen for a few weeks. CAUSES  A foot contusion comes from a direct blow to that area, such as a heavy object falling on the foot. SYMPTOMS   Swelling of the foot.  Discoloration of the foot.  Tenderness or soreness of the foot. DIAGNOSIS  You will have a physical exam and will be asked about your history. You may need an X-ray of your foot to look for a broken bone (fracture).  TREATMENT  An elastic wrap may be recommended to support your foot. Resting, elevating, and applying cold compresses to your foot are often the best treatments for a foot contusion. Over-the-counter medicines may also be recommended for pain control. HOME CARE INSTRUCTIONS   Put ice on the injured area.  Put ice in a plastic bag.  Place a towel between your skin and the bag.  Leave the ice on for 15-20 minutes, 03-04 times a day.  Only take over-the-counter or prescription medicines for pain, discomfort, or fever as directed by your caregiver.  If told, use an elastic wrap as directed. This can help reduce swelling. You may remove the wrap for sleeping, showering, and bathing. If your toes become numb, cold, or blue, take the wrap off and reapply it more loosely.  Elevate your foot with pillows to reduce swelling.  Try to avoid standing or walking while the foot is painful. Do not resume use until instructed by your caregiver. Then, begin use gradually. If pain develops, decrease use. Gradually increase activities that do not cause discomfort until you have normal use of your foot.  See your caregiver as directed. It is very important to keep all follow-up appointments in order to avoid any lasting problems with your foot,  including long-term (chronic) pain. SEEK IMMEDIATE MEDICAL CARE IF:   You have increased redness, swelling, or pain in your foot.  Your swelling or pain is not relieved with medicines.  You have loss of feeling in your foot or are unable to move your toes.  Your foot turns cold or blue.  You have pain when you move your toes.  Your foot becomes warm to the touch.  Your contusion does not improve in 2 days. MAKE SURE YOU:   Understand these instructions.  Will watch your condition.  Will get help right away if you are not doing well or get worse.   This information is not intended to replace advice given to you by your health care provider. Make sure you discuss any questions you have with your health care provider.   Document Released: 12/02/2005 Document Revised: 08/12/2011 Document Reviewed: 10/17/2014 Elsevier Interactive Patient Education 2016 [employer]  Cryotherapy Cryotherapy is when you put ice on your injury. Ice helps lessen pain and puffiness (swelling) after an injury. Ice works the best when you start using it in the first 24 to 48 hours after an injury. HOME CARE  Put a dry or damp towel between the ice pack and your skin.  You may press gently on the ice pack.  Leave the ice on for no more than 10 to 20 minutes at a time.  Check your skin after 5 minutes to make sure your skin is okay.  Rest at least  20 minutes between ice pack uses. °· Stop using ice when your skin loses feeling (numbness). °· Do not use ice on someone who cannot tell you when it hurts. This includes small children and people with memory problems (dementia). °GET HELP RIGHT AWAY IF: °· You have white spots on your skin. °· Your skin turns blue or pale. °· Your skin feels waxy or hard. °· Your puffiness gets worse. °MAKE SURE YOU:  °· Understand these instructions. °· Will watch your condition. °· Will get help right away if you are not doing well or get worse. °  °This information is not  intended to replace advice given to you by your health care provider. Make sure you discuss any questions you have with your health care provider. °  °Document Released: 07/30/2007 Document Revised: 05/05/2011 Document Reviewed: 10/03/2010 °Elsevier Interactive Patient Education ©2016 [employer] Inc. ° °

## 2015-06-23 NOTE — ED Notes (Signed)
States a trailer fell onto foot 2 nights ago. Has bruising to 3 toes

## 2015-06-23 NOTE — ED Notes (Signed)
Pt to ed with c/o right foot pain after a trailer rolled over his foot 2 days ago.  Pt reports pain with ambulation.

## 2015-06-23 NOTE — ED Notes (Signed)
Discussed discharge instructions and follow-up care with patient. No questions or concerns at this time. Pt stable at discharge.  

## 2015-09-10 ENCOUNTER — Emergency Department
Admission: EM | Admit: 2015-09-10 | Discharge: 2015-09-10 | Disposition: A | Discharge status: Home / Self Care | Payer: Medicaid Other | Attending: Emergency Medicine | Admitting: Emergency Medicine

## 2015-09-10 ENCOUNTER — Emergency Department: Payer: Medicaid Other

## 2015-09-10 DIAGNOSIS — Z791 Long term (current) use of non-steroidal anti-inflammatories (NSAID): Secondary | ICD-10-CM | POA: Diagnosis not present

## 2015-09-10 DIAGNOSIS — Z79899 Other long term (current) drug therapy: Secondary | ICD-10-CM | POA: Insufficient documentation

## 2015-09-10 DIAGNOSIS — F1290 Cannabis use, unspecified, uncomplicated: Secondary | ICD-10-CM | POA: Insufficient documentation

## 2015-09-10 DIAGNOSIS — W500XXA Accidental hit or strike by another person, initial encounter: Secondary | ICD-10-CM | POA: Diagnosis not present

## 2015-09-10 DIAGNOSIS — Z792 Long term (current) use of antibiotics: Secondary | ICD-10-CM | POA: Diagnosis not present

## 2015-09-10 DIAGNOSIS — F17210 Nicotine dependence, cigarettes, uncomplicated: Secondary | ICD-10-CM | POA: Insufficient documentation

## 2015-09-10 DIAGNOSIS — Z8669 Personal history of other diseases of the nervous system and sense organs: Secondary | ICD-10-CM | POA: Insufficient documentation

## 2015-09-10 DIAGNOSIS — Y92007 Garden or yard of unspecified non-institutional (private) residence as the place of occurrence of the external cause: Secondary | ICD-10-CM | POA: Insufficient documentation

## 2015-09-10 DIAGNOSIS — Y9389 Activity, other specified: Secondary | ICD-10-CM | POA: Insufficient documentation

## 2015-09-10 DIAGNOSIS — M79641 Pain in right hand: Secondary | ICD-10-CM | POA: Diagnosis present

## 2015-09-10 DIAGNOSIS — S60221A Contusion of right hand, initial encounter: Secondary | ICD-10-CM | POA: Diagnosis not present

## 2015-09-10 DIAGNOSIS — Y999 Unspecified external cause status: Secondary | ICD-10-CM | POA: Diagnosis not present

## 2015-09-10 MED ORDER — MELOXICAM 15 MG PO TABS: 15.0000 mg | ORAL_TABLET | Freq: Every day | ORAL | Status: DC

## 2015-09-10 NOTE — ED Notes (Signed)
See triage note  States he injured right hand yesterday while playing with children in yard

## 2015-09-10 NOTE — ED Provider Notes (Signed)
[facility] Emergency Department Provider Note    ____________________________________________  Time seen: Approximately 11:48 AM  I have reviewed the triage vital signs and the nursing notes.   HISTORY  Chief Complaint Hand Pain    HPI NICKLAUS ALVIAR is a 30 y.o. male who presents to the ED today due to right hand injury occurring yesterday evening. Patient states that he had a Boxer's fracture in the same hand 4 years ago and feels like it never healed right. He was playing in the yard yesterday with his sons when the posterior aspect of his hand incidentally struck his son's knee cap. Patient states that he felt a click and saw a bone pop up. Patient has been taking anti-inflammatories without much relief. Patient notes some numbness and swelling in the right extremity. Pain is currently 9/10.   Past Medical History  Diagnosis Date  . Small bowel obstruction (HCC)     When he was a baby  . Broken jaw (HCC)   . Seizures (HCC)     There are no active problems to display for this patient.   Past Surgical History  Procedure Laterality Date  . Mandible fracture surgery      Current Outpatient Rx  Name  Route  Sig  Dispense  Refill  . docusate sodium (COLACE) 100 MG capsule   Oral   Take 1 capsule (100 mg total) by mouth daily as needed.   30 capsule   2   . ibuprofen (ADVIL,MOTRIN) 800 MG tablet   Oral   Take 1 tablet (800 mg total) by mouth every 8 (eight) hours as needed for moderate pain.   15 tablet   0   . meloxicam (MOBIC) 15 MG tablet   Oral   Take 1 tablet (15 mg total) by mouth daily.   30 tablet   0   . sulfamethoxazole-trimethoprim (BACTRIM DS,SEPTRA DS) 800-160 MG per tablet   Oral   Take 1 tablet by mouth 2 (two) times daily.   20 tablet   0   . traMADol (ULTRAM) 50 MG tablet   Oral   Take 1 tablet (50 mg total) by mouth every 6 (six) hours as needed for moderate pain.   12 tablet   0      Allergies Dilantin  No family history on file.  Social History Social History  Substance Use Topics  . Smoking status: Current Every Day Smoker -- 0.50 packs/day    Types: Cigarettes  . Smokeless tobacco: Never Used  . Alcohol Use: Yes     Comment: Socially    Review of Systems Constitutional: No fever/chills Cardiovascular: Denies chest pain. Respiratory: Denies shortness of breath. Gastrointestinal: No abdominal pain.  Mild nausea due to pain, no vomiting.  No diarrhea.  No constipation. Musculoskeletal: Negative for back pain. Pain localized to right hand, "pinky finger" Skin: Negative for rash. Neurological: Negative for headaches, focal weakness or numbness. 10-point ROS otherwise negative.  ____________________________________________   PHYSICAL EXAM:  VITAL SIGNS: ED Triage Vitals  Enc Vitals Group     BP 09/10/15 1124 126/75 mmHg     Pulse Rate 09/10/15 1124 68     Resp 09/10/15 1124 17     Temp 09/10/15 1124 97.6 F (36.4 C)     Temp Source 09/10/15 1124 Oral     SpO2 09/10/15 1124 99 %     Weight 09/10/15 1119 150 lb (68.04 kg)     Height 09/10/15 1119  (  1.778 m)     Head Cir --      Peak Flow --      Pain Score 09/10/15 1119 9     Pain Loc --      Pain Edu? --      Excl. in GC? --     Constitutional: Alert and oriented. Well appearing and in no acute distress. Head: Atraumatic. Nose: No congestion/rhinnorhea. Mouth/Throat: Mucous membranes are moist.  Oropharynx non-erythematous. Neck: No stridor.   Cardiovascular: Normal rate, regular rhythm. Grossly normal heart sounds.  Good peripheral circulation. Respiratory: Normal respiratory effort.  No retractions. Lungs CTAB. Gastrointestinal: Soft and nontender. No distention. No abdominal bruits. No CVA tenderness. Musculoskeletal: No lower extremity tenderness nor edema.  No joint effusions. Swelling and mild bruising on 5th metacarpal. Very point tender over subsequent areas. Neurologic:   Normal speech and language. No gross focal neurologic deficits are appreciated. No gait instability. Skin:  Skin is warm, dry and intact. No rash noted. Psychiatric: Mood and affect are normal. Speech and behavior are normal.  ____________________________________________   LABS (all labs ordered are listed, but only abnormal results are displayed)  Labs Reviewed - No data to display ____________________________________________  EKG  Not indicated ____________________________________________  RADIOLOGY  No osseous changes ____________________________________________   PROCEDURES  Procedure(s) performed: None  Procedures  Critical Care performed: No  ____________________________________________   INITIAL IMPRESSION / ASSESSMENT AND PLAN / ED COURSE  Pertinent labs & imaging results that were available during my care of the patient were reviewed by me and considered in my medical decision making (see chart for details).  Patient has contusion to right hand. No fracture/osseous changes on x-ray. Patient to use anti-inflammatories and ice as needed. ____________________________________________   FINAL CLINICAL IMPRESSION(S) / ED DIAGNOSES  Final diagnoses:  Hand contusion, right, initial encounter      NEW MEDICATIONS STARTED DURING THIS VISIT:  Discharge Medication List as of 09/10/2015 12:49 PM    START taking these medications   Details  meloxicam (MOBIC) 15 MG tablet Take 1 tablet (15 mg total) by mouth daily., Starting 09/10/2015, Until Discontinued, Print         Note:  This document was prepared using Dragon voice recognition software and may include unintentional dictation errors.   Evangeline DakinCharles M Trapper Meech, PA-C 09/10/15 1310  Sharman CheekPhillip Stafford, MD 09/10/15 1535

## 2015-09-10 NOTE — Discharge Instructions (Signed)
Hand Contusion ° A hand contusion is a deep bruise to the hand. Contusions happen when an injury causes bleeding under the skin. Signs of bruising include pain, puffiness (swelling), and discolored skin. The contusion may turn blue, purple, or yellow. °HOME CARE °· Put ice on the injured area. °¨ Put ice in a plastic bag. °¨ Place a towel between your skin and the bag. °¨ Leave the ice on for 15-20 minutes, 03-04 times a day. °· Only take medicines as told by your doctor. °· Use an elastic wrap only as told. You may remove the wrap for sleeping, showering, and bathing. Take the wrap off if you lose feeling (have numbness) in your fingers, or they turn blue or cold. Put the wrap on more loosely. °· Keep the hand raised (elevated) with pillows. °· Avoid using your hand too much if it is painful. °GET HELP RIGHT AWAY IF:  °· You have more redness, puffiness, or pain in your hand. °· Your puffiness or pain does not get better with medicine. °· You lose feeling in your hand, or you cannot move your fingers. °· Your hand turns cold or blue. °· You have pain when you move your fingers. °· Your hand feels warm. °· Your contusion does not get better in 2 days. °MAKE SURE YOU:  °· Understand these instructions. °· Will watch this condition. °· Will get help right away if you are not doing well or you get worse. °  °This information is not intended to replace advice given to you by your health care provider. Make sure you discuss any questions you have with your health care provider. °  °Document Released: 07/30/2007 Document Revised: 03/03/2014 Document Reviewed: 08/04/2011 °Elsevier Interactive Patient Education ©2016 [employer] ° °

## 2015-09-10 NOTE — ED Notes (Signed)
Pt states he was outside with kids playing yesterday evening and injured his right hand..Marland Kitchen

## 2015-11-28 ENCOUNTER — Emergency Department: Payer: Medicaid Other

## 2015-11-28 ENCOUNTER — Emergency Department
Admission: EM | Admit: 2015-11-28 | Discharge: 2015-11-28 | Disposition: A | Discharge status: Home / Self Care | Payer: Medicaid Other | Attending: Emergency Medicine | Admitting: Emergency Medicine

## 2015-11-28 DIAGNOSIS — Y92007 Garden or yard of unspecified non-institutional (private) residence as the place of occurrence of the external cause: Secondary | ICD-10-CM | POA: Insufficient documentation

## 2015-11-28 DIAGNOSIS — F17210 Nicotine dependence, cigarettes, uncomplicated: Secondary | ICD-10-CM | POA: Diagnosis not present

## 2015-11-28 DIAGNOSIS — Y9389 Activity, other specified: Secondary | ICD-10-CM | POA: Insufficient documentation

## 2015-11-28 DIAGNOSIS — S99921A Unspecified injury of right foot, initial encounter: Secondary | ICD-10-CM | POA: Diagnosis present

## 2015-11-28 DIAGNOSIS — Y999 Unspecified external cause status: Secondary | ICD-10-CM | POA: Insufficient documentation

## 2015-11-28 DIAGNOSIS — W228XXA Striking against or struck by other objects, initial encounter: Secondary | ICD-10-CM | POA: Diagnosis not present

## 2015-11-28 DIAGNOSIS — S93601A Unspecified sprain of right foot, initial encounter: Secondary | ICD-10-CM | POA: Diagnosis not present

## 2015-11-28 DIAGNOSIS — Z792 Long term (current) use of antibiotics: Secondary | ICD-10-CM | POA: Diagnosis not present

## 2015-11-28 DIAGNOSIS — Z791 Long term (current) use of non-steroidal anti-inflammatories (NSAID): Secondary | ICD-10-CM | POA: Insufficient documentation

## 2015-11-28 MED ORDER — NAPROXEN 500 MG PO TABS
500.0000 mg | ORAL_TABLET | Freq: Once | ORAL | Status: AC
  Administered 2015-11-28: 500 mg via ORAL

## 2015-11-28 MED ORDER — NAPROXEN 500 MG PO TABS: 500.0000 mg | ORAL_TABLET | Freq: Two times a day (BID) | ORAL | 0 refills | Status: DC

## 2015-11-28 MED ORDER — NAPROXEN 500 MG PO TABS
ORAL_TABLET | ORAL | Status: AC
  Filled 2015-11-28: qty 1

## 2015-11-28 MED ORDER — DIAZEPAM 5 MG PO TABS
5.0000 mg | ORAL_TABLET | Freq: Once | ORAL | Status: AC
  Administered 2015-11-28: 5 mg via ORAL

## 2015-11-28 MED ORDER — DIAZEPAM 5 MG PO TABS
ORAL_TABLET | ORAL | Status: AC
  Filled 2015-11-28: qty 1

## 2015-11-28 MED ORDER — CYCLOBENZAPRINE HCL 10 MG PO TABS
10.0000 mg | ORAL_TABLET | Freq: Three times a day (TID) | ORAL | 0 refills | Status: DC | PRN
Start: 2015-11-28 — End: 2016-05-20

## 2015-11-28 NOTE — ED Notes (Signed)
Pt reports that 4-5 hours ago he kicked a car door - pt stands he cannot bear weight on the right foot since kicking the door - he reports the pain is worse inside the arch of his foot

## 2015-11-28 NOTE — ED Provider Notes (Signed)
[facility] Emergency Department Provider Note ____________________________________________  Time seen: Approximately 11:44 PM  I have reviewed the triage vital signs and the nursing notes.   HISTORY  Chief Complaint Foot Injury    HPI Dakota Clements is a 30 y.o. male who presents to the emergency department with complaint of right foot pain since this afternoon.  Patient states he "kicked a car door" with his right foot and felt pain in the foot.  He reports doing a couple of activities around the yard before he went inside his house, sat down and took off his shoe.  Patient states that the pain then intensified and his right foot became swollen to the point that he could not put his shoe back on.  Patient describes the pain as throbbing and 10/10.  He reports that he has pain of the entire right foot, but the pain is greatest on the medial dorsal aspect of the foot near the arch.  He endorses loss of ROM of the right ankle and toes due to pain.  Patient denies numbness or tingling of the feet or toes.  Patient states that he has not taken any medication for the pain yet.    Past Medical History:  Diagnosis Date  . Broken jaw (HCC)   . Seizures (HCC)   . Small bowel obstruction    When he was a baby    There are no active problems to display for this patient.   Past Surgical History:  Procedure Laterality Date  . MANDIBLE FRACTURE SURGERY      Prior to Admission medications   Medication Sig Start Date End Date Taking? Authorizing Provider  cyclobenzaprine (FLEXERIL) 10 MG tablet Take 1 tablet (10 mg total) by mouth 3 (three) times daily as needed for muscle spasms. 11/28/15   Chinita Pesterari B Riannah Stagner, FNP  docusate sodium (COLACE) 100 MG capsule Take 1 capsule (100 mg total) by mouth daily as needed. 04/01/15 03/31/16  Jeanmarie PlantJames A McShane, MD  ibuprofen (ADVIL,MOTRIN) 800 MG tablet Take 1 tablet (800 mg total) by mouth every 8 (eight) hours as needed for moderate  pain. 07/27/14   Joni Reiningonald K Smith, PA-C  meloxicam (MOBIC) 15 MG tablet Take 1 tablet (15 mg total) by mouth daily. 09/10/15   Charmayne Sheerharles M Beers, PA-C  naproxen (NAPROSYN) 500 MG tablet Take 1 tablet (500 mg total) by mouth 2 (two) times daily with a meal. 11/28/15   Bryson Palen B Jemel Ono, FNP  sulfamethoxazole-trimethoprim (BACTRIM DS,SEPTRA DS) 800-160 MG per tablet Take 1 tablet by mouth 2 (two) times daily. 07/27/14   Joni Reiningonald K Smith, PA-C  traMADol (ULTRAM) 50 MG tablet Take 1 tablet (50 mg total) by mouth every 6 (six) hours as needed for moderate pain. 07/27/14   Joni Reiningonald K Smith, PA-C    Allergies Dilantin [phenytoin sodium extended]  No family history on file.  Social History Social History  Substance Use Topics  . Smoking status: Current Every Day Smoker    Packs/day: 0.50    Types: Cigarettes  . Smokeless tobacco: Never Used  . Alcohol use Yes     Comment: Socially    Review of Systems Constitutional: No recent illness. Cardiovascular: Denies chest pain or palpitations. Respiratory: Denies shortness of breath. Musculoskeletal: Pain in the right foot. Endorses loss of ROM of right ankle and toes due to pain.   Skin: Negative for rash, wound, lesion. Positive for edema of the right foot.  Neurological: Negative for focal weakness or numbness.  ____________________________________________  PHYSICAL EXAM:  VITAL SIGNS: ED Triage Vitals  Enc Vitals Group     BP 11/28/15 2023 121/84     Pulse Rate 11/28/15 2023 79     Resp 11/28/15 2023 18     Temp 11/28/15 2023 98.2 F (36.8 C)     Temp Source 11/28/15 2023 Oral     SpO2 11/28/15 2023 100 %     Weight --      Height --      Head Circumference --      Peak Flow --      Pain Score 11/28/15 2025 10     Pain Loc --      Pain Edu? --      Excl. in GC? --     Constitutional: Alert and oriented. Expressing severe amounts of pain and in an agitated state, but in no acute distress. Eyes: Conjunctivae are normal. EOMI. Head:  Atraumatic. Neck: No stridor. Supple. Respiratory: Normal respiratory effort.   Musculoskeletal: Tenderness on palpation of entire right foot, with greatest tenderness along medial aspect above the arch.  ROM of ankles, toes bilaterally intact, though limited by pain on the right side.  Neurologic:  Normal speech and language. No gross focal neurologic deficits are appreciated. Speech is normal. No gait instability. Skin:  Skin is warm, dry and intact. No ecchymosis present.  Mild edema of right foot along the medial aspect.   ____________________________________________   LABS (all labs ordered are listed, but only abnormal results are displayed)  Labs Reviewed - No data to display ____________________________________________  RADIOLOGY  DG Ankle Complete Right  No acute bony abnormalities visualized. Joint spaces appear appropriate.  DG Foot Complete Right  No acute bony abnormalities visualized.  Joint spaces appear appropriate.  I, Kem Boroughs, personally viewed and evaluated these images (plain radiographs) as part of my medical decision making, as well as reviewing the written report by the radiologist.  ____________________________________________   PROCEDURES  Procedure(s) performed: ACE applied by RN and crutches fitted to height. Crutch training completed. Patient was neurovascularly intact post application of ACE bandage.   ____________________________________________   INITIAL IMPRESSION / ASSESSMENT AND PLAN / ED COURSE  Clinical Course    Pertinent labs & imaging results that were available during my care of the patient were reviewed by me and considered in my medical decision making (see chart for details).  Patient's diagnosis consistent with right foot sprain.  Patient received Valium and naproxen in the ED to control his anxiety and pain.  He will receive prescriptions for naproxen and Flexeril.  Patient was given an ACE bandage for the right foot and  crutches.  Patient is to follow up with his PCP or podiatry if his symptoms worsen or do not improve.  Patient given instructions for return to the ED for severe worsening of symptoms.  ____________________________________________   FINAL CLINICAL IMPRESSION(S) / ED DIAGNOSES  Final diagnoses:  Sprain of right foot, initial encounter       Chinita Pester, FNP 11/29/15 1548    Sharman Cheek, MD 11/30/15 1712

## 2015-11-28 NOTE — ED Triage Notes (Signed)
Pt has right foot pain.  Pt kicked a car door.  Painful to ambulated.

## 2015-11-28 NOTE — Discharge Instructions (Signed)
Follow up with Dr. Orland Jarredroxler for symptoms that are not improving over the week. Return to the ER for symptoms that change or worsen if you are unable to schedule an appointment.

## 2016-01-18 ENCOUNTER — Emergency Department
Admission: EM | Admit: 2016-01-18 | Discharge: 2016-01-18 | Disposition: A | Discharge status: Home / Self Care | Payer: Medicaid Other | Attending: Emergency Medicine | Admitting: Emergency Medicine

## 2016-01-18 ENCOUNTER — Emergency Department: Payer: Medicaid Other

## 2016-01-18 ENCOUNTER — Encounter: Payer: Self-pay | Admitting: Medical Oncology

## 2016-01-18 DIAGNOSIS — W268XXA Contact with other sharp object(s), not elsewhere classified, initial encounter: Secondary | ICD-10-CM | POA: Insufficient documentation

## 2016-01-18 DIAGNOSIS — Y9389 Activity, other specified: Secondary | ICD-10-CM | POA: Diagnosis not present

## 2016-01-18 DIAGNOSIS — S61215A Laceration without foreign body of left ring finger without damage to nail, initial encounter: Secondary | ICD-10-CM | POA: Diagnosis not present

## 2016-01-18 DIAGNOSIS — F17210 Nicotine dependence, cigarettes, uncomplicated: Secondary | ICD-10-CM | POA: Diagnosis not present

## 2016-01-18 DIAGNOSIS — Y929 Unspecified place or not applicable: Secondary | ICD-10-CM | POA: Diagnosis not present

## 2016-01-18 DIAGNOSIS — Z23 Encounter for immunization: Secondary | ICD-10-CM | POA: Insufficient documentation

## 2016-01-18 DIAGNOSIS — Y999 Unspecified external cause status: Secondary | ICD-10-CM | POA: Diagnosis not present

## 2016-01-18 DIAGNOSIS — Z791 Long term (current) use of non-steroidal anti-inflammatories (NSAID): Secondary | ICD-10-CM | POA: Insufficient documentation

## 2016-01-18 MED ORDER — BUPIVACAINE HCL (PF) 0.5 % IJ SOLN
INTRAMUSCULAR | Status: AC
  Administered 2016-01-18: 5 mL
  Filled 2016-01-18: qty 30

## 2016-01-18 MED ORDER — LIDOCAINE HCL (PF) 1 % IJ SOLN
INTRAMUSCULAR | Status: AC
  Administered 2016-01-18: 5 mL
  Filled 2016-01-18: qty 5

## 2016-01-18 MED ORDER — BUPIVACAINE HCL 0.5 % IJ SOLN
50.0000 mL | Freq: Once | INTRAMUSCULAR | Status: AC
  Administered 2016-01-18: 5 mL
  Filled 2016-01-18: qty 50

## 2016-01-18 MED ORDER — TETANUS-DIPHTH-ACELL PERTUSSIS 5-2.5-18.5 LF-MCG/0.5 IM SUSP
0.5000 mL | Freq: Once | INTRAMUSCULAR | Status: AC
  Administered 2016-01-18: 0.5 mL via INTRAMUSCULAR
  Filled 2016-01-18: qty 0.5

## 2016-01-18 MED ORDER — LIDOCAINE HCL 1 % IJ SOLN
5.0000 mL | Freq: Once | INTRAMUSCULAR | Status: AC
  Administered 2016-01-18: 5 mL
  Filled 2016-01-18: qty 5

## 2016-01-18 MED ORDER — OXYCODONE-ACETAMINOPHEN 5-325 MG PO TABS
1.0000 | ORAL_TABLET | Freq: Once | ORAL | Status: AC
  Administered 2016-01-18: 1 via ORAL
  Filled 2016-01-18: qty 1

## 2016-01-18 MED ORDER — TRAMADOL HCL 50 MG PO TABS: 50.0000 mg | ORAL_TABLET | Freq: Four times a day (QID) | ORAL | 0 refills | Status: DC | PRN

## 2016-01-18 NOTE — ED Provider Notes (Signed)
[facility] Emergency Department Provider Note  ____________________________________________  Time seen: Approximately 4:49 PM  I have reviewed the triage vital signs and the nursing notes.   HISTORY  Chief Complaint Laceration   HPI Dakota Clements is a 30 y.o. male presenting to the emergency department with a 1 cm wide semilunar flap of the distal phalanx of the left ring finger. Patient states that he caught his finger in a saw while working today. He is unsure of his tetanus status. He has been actively using the digit.  Past Medical History:  Diagnosis Date  . Broken jaw (HCC)   . Seizures (HCC)   . Small bowel obstruction    When he was a baby    There are no active problems to display for this patient.   Past Surgical History:  Procedure Laterality Date  . MANDIBLE FRACTURE SURGERY      Prior to Admission medications   Medication Sig Start Date End Date Taking? Authorizing Provider  cyclobenzaprine (FLEXERIL) 10 MG tablet Take 1 tablet (10 mg total) by mouth 3 (three) times daily as needed for muscle spasms. 11/28/15   Chinita Pesterari B Triplett, FNP  docusate sodium (COLACE) 100 MG capsule Take 1 capsule (100 mg total) by mouth daily as needed. 04/01/15 03/31/16  Jeanmarie PlantJames A McShane, MD  ibuprofen (ADVIL,MOTRIN) 800 MG tablet Take 1 tablet (800 mg total) by mouth every 8 (eight) hours as needed for moderate pain. 07/27/14   Joni Reiningonald K Smith, PA-C  meloxicam (MOBIC) 15 MG tablet Take 1 tablet (15 mg total) by mouth daily. 09/10/15   Charmayne Sheerharles M Beers, PA-C  naproxen (NAPROSYN) 500 MG tablet Take 1 tablet (500 mg total) by mouth 2 (two) times daily with a meal. 11/28/15   Cari B Triplett, FNP  sulfamethoxazole-trimethoprim (BACTRIM DS,SEPTRA DS) 800-160 MG per tablet Take 1 tablet by mouth 2 (two) times daily. 07/27/14   Joni Reiningonald K Smith, PA-C  traMADol (ULTRAM) 50 MG tablet Take 1 tablet (50 mg total) by mouth every 6 (six) hours as needed. 01/18/16 01/17/17  Orvil FeilJaclyn M  Zoriyah Scheidegger, PA-C    Allergies Dilantin [phenytoin sodium extended]  No family history on file.  Social History Social History  Substance Use Topics  . Smoking status: Current Every Day Smoker    Packs/day: 0.50    Types: Cigarettes  . Smokeless tobacco: Never Used  . Alcohol use Yes     Comment: Socially    Review of Systems  Constitutional: Negative for fever/chills Respiratory: Negative for shortness of breath. Musculoskeletal: Negative for pain. Skin: Patient has a 1 cm wide semilunar flap localized to the distal phalanx of his left ring finger. Neurological: Negative for headaches, focal weakness or numbness. ____________________________________________   PHYSICAL EXAM:  VITAL SIGNS: ED Triage Vitals  Enc Vitals Group     BP 01/18/16 1436 133/74     Pulse Rate 01/18/16 1436 66     Resp 01/18/16 1436 16     Temp 01/18/16 1436 97.6 F (36.4 C)     Temp Source 01/18/16 1436 Oral     SpO2 01/18/16 1436 100 %     Weight 01/18/16 1433 142 lb (64.4 kg)     Height 01/18/16 1433 5\' 11"  (1.803 m)     Head Circumference --      Peak Flow --      Pain Score 01/18/16 1433 10     Pain Loc --      Pain Edu? --  Excl. in GC? --      Constitutional: Alert and oriented. Well appearing and in no acute distress. Eyes: Conjunctivae are normal. EOMI. Neck: No stridor. Cardiovascular: Regular rate and rhythm without gallops murmurs or rubs. Respiratory: Normal respiratory effort.  No retractions. Lungs clear to auscultation bilaterally. Musculoskeletal: Patient's flexor and extensor tendons are intact. Neurologic:  Normal speech and language. No gross focal neurologic deficits are appreciated. Skin:  Patient has a 1 cm wide laceration localized to the distal phalanx of his left ring finger.  ____________________________________________   LABS (all labs ordered are listed, but only abnormal results are displayed)  Labs Reviewed - No data to  display ____________________________________________   RADIOLOGY  DG Ring Finger: No fractures or bony abnormalities.  ____________________________________________   PROCEDURES Tdap Tramadol Splint Application    Procedure(s) performed:  LACERATION REPAIR Performed by: Orvil FeilJaclyn M Adeliz Tonkinson Authorized by: Orvil FeilJaclyn M Rashan Rounsaville Consent: Verbal consent obtained. Risks and benefits: risks, benefits and alternatives were discussed Consent given by: patient Patient identity confirmed: provided demographic data Prepped and Draped in normal sterile fashion  Wound explored: With normal saline   Laceration Location: Distal phalanx of the left ring finger.  Laceration Length: 1 cm wide semilunar flap   No Foreign Bodies seen or palpated  Anesthesia: Thecal infiltration  Local anesthetic: lidocaine 1% without epinephrine  Anesthetic total: 2 ml  Irrigation method: syringe Amount of cleaning: standard  Skin closure: 4-0 Prolene  Number of sutures: 7   Technique: Simple Interrupted   Patient tolerance: Patient tolerated the procedure well with no immediate complications.  ____________________________________________   INITIAL IMPRESSION / ASSESSMENT AND PLAN / ED COURSE  Clinical Course     Pertinent labs & imaging results that were available during my care of the patient were reviewed by me and considered in my medical decision making (see chart for details).  Assessment and plan: Left ring finger laceration: Patient underwent laceration repair in the emergency department. Patient tolerated the procedure well. He was discharged with Keflex and a 5 day supply of tramadol to be used as needed for pain. Patient education was provided regarding having sutures removed in 8 days by his primary care provider. All patient questions were answered. ____________________________________________   FINAL CLINICAL IMPRESSION(S) / ED DIAGNOSES  Final diagnoses:  Laceration of left ring  finger without foreign body without damage to nail, initial encounter    New Prescriptions   TRAMADOL (ULTRAM) 50 MG TABLET    Take 1 tablet (50 mg total) by mouth every 6 (six) hours as needed.    Note:  This document was prepared using Dragon voice recognition software and may include unintentional dictation errors.       Orvil FeilJaclyn M Taralyn Ferraiolo, PA-C 01/18/16 1702    Orvil FeilJaclyn M Kingslee Dowse, PA-C 01/18/16 1819    Sharyn CreamerMark Quale, MD 01/30/16 2145

## 2016-01-18 NOTE — ED Triage Notes (Signed)
Pt cut left hand ring finger on saw.

## 2016-05-20 ENCOUNTER — Emergency Department
Admission: EM | Admit: 2016-05-20 | Discharge: 2016-05-20 | Disposition: A | Discharge status: Home / Self Care | Payer: Medicaid Other | Attending: Emergency Medicine | Admitting: Emergency Medicine

## 2016-05-20 ENCOUNTER — Encounter: Payer: Self-pay | Admitting: Emergency Medicine

## 2016-05-20 DIAGNOSIS — K029 Dental caries, unspecified: Secondary | ICD-10-CM | POA: Insufficient documentation

## 2016-05-20 DIAGNOSIS — K0889 Other specified disorders of teeth and supporting structures: Secondary | ICD-10-CM | POA: Diagnosis present

## 2016-05-20 DIAGNOSIS — F17210 Nicotine dependence, cigarettes, uncomplicated: Secondary | ICD-10-CM | POA: Diagnosis not present

## 2016-05-20 DIAGNOSIS — K047 Periapical abscess without sinus: Secondary | ICD-10-CM | POA: Insufficient documentation

## 2016-05-20 MED ORDER — IBUPROFEN 600 MG PO TABS
600.0000 mg | ORAL_TABLET | Freq: Once | ORAL | Status: AC
  Administered 2016-05-20: 600 mg via ORAL
  Filled 2016-05-20: qty 1

## 2016-05-20 MED ORDER — CLINDAMYCIN PHOSPHATE 300 MG/2ML IJ SOLN
INTRAMUSCULAR | Status: AC
  Administered 2016-05-20: 600 mg via INTRAMUSCULAR
  Filled 2016-05-20: qty 2

## 2016-05-20 MED ORDER — CLINDAMYCIN HCL 150 MG PO CAPS: ORAL_CAPSULE | ORAL | 0 refills | Status: DC

## 2016-05-20 MED ORDER — CLINDAMYCIN PHOSPHATE 900 MG/6ML IJ SOLN
600.0000 mg | Freq: Once | INTRAMUSCULAR | Status: AC
  Administered 2016-05-20: 600 mg via INTRAMUSCULAR

## 2016-05-20 MED ORDER — IBUPROFEN 600 MG PO TABS: 600.0000 mg | ORAL_TABLET | Freq: Three times a day (TID) | ORAL | 0 refills | Status: DC | PRN

## 2016-05-20 NOTE — ED Notes (Signed)
See triage note states he has a metal plate in jaw area  Developed swelling to left gumline couple of days ago with increased pain  Unsure if he has a dental abscess

## 2016-05-20 NOTE — Discharge Instructions (Signed)
You need follow-up with one of dental clinics listed below. Discontinue smoking. Take clindamycin until completely finished. Ibuprofen 600 mg 3 times a day with food. Make an appointment with your primary care doctor for any continued problems.  OPTIONS FOR DENTAL FOLLOW UP CARE  [place] Department of Health and Human Services - Local Safety Net Dental Clinics [email or web address]://www.ncdhhs.gov/dph/oralhealth/services/safetynetclinics.htm   [facility] [phone])  [facility] [phone])  [facility] [phone]  [facility] [phone])  [facility] [phone]) This clinic caters to the indigent population and is on a lottery system. Location: [employer] [address], [address], [place] [facility] Hours: Wednesdays from 6pm - 9pm, patients seen by a lottery system. For dates, call or go to [email or web address] Services: Cleanings, fillings and simple extractions. Payment Options: DENTAL WORK IS FREE OF CHARGE. Bring proof of income or support. Best way to get seen: Arrive at 5:15 pm - this is a lottery, NOT first come/first serve, so arriving earlier will not increase your chances of being seen.     [facility] [phone] Select option 1 for emergencies   Location: [facility], [address], [address], [place] [facility] Hours: No walk-ins accepted - call the day before to schedule an appointment. Check in times are 9:30 am and 1:30 pm. Services: Simple extractions, temporary fillings, pulpectomy/pulp debridement, uncomplicated abscess drainage. Payment Options: PAYMENT IS DUE AT THE TIME OF SERVICE.  Fee is usually $100-200, additional surgical procedures (e.g. abscess drainage) may be extra. Cash, checks, Visa/MasterCard accepted.  Can file Medicaid if patient is covered for dental - patient should call case worker to  check. No discount for [facility] patients. Best way to get seen: MUST call the day before and get onto the schedule. Can usually be seen the next 1-2 days. No walk-ins accepted.     [facility] [phone]   Location: [facility], [address], [place] [facility] Hours: M, W, Th, F 8am or 1:30pm, Tues 9a or 1:30 - first come/first served. Services: Simple extractions, temporary fillings, uncomplicated abscess drainage.  You do not need to be an [place] resident. Payment Options: PAYMENT IS DUE AT THE TIME OF SERVICE. Dental insurance, otherwise sliding scale - bring proof of income or support. Depending on income and treatment needed, cost is usually $50-200. Best way to get seen: Arrive early as it is first come/first served.     [facility] [phone]   Location: [address] Clinic Hours: Mon-Thu 8a-5p Services: Most basic dental services including extractions and fillings. Payment Options: PAYMENT IS DUE AT THE TIME OF SERVICE. Sliding scale, up to 50% off - bring proof if income or support. Medicaid with dental option accepted. Best way to get seen: Call to schedule an appointment, can usually be seen within 2 weeks OR they will try to see walk-ins - show up at 8a or 2p (you may have to wait).     [facility] [phone] [place] RESIDENTS ONLY   Location: [facility], [address], [place], [place] [id number] Clinic Hours: By appointment only. Monday - Thursday 8am-5pm, Friday 8am-12pm Services: Cleanings, fillings, extractions. Payment Options: PAYMENT IS DUE AT THE TIME OF SERVICE. Cash, Visa or MasterCard. Sliding scale - $30 minimum per service. Best way to get seen: Come in to office, complete packet and make an appointment - need proof  of income or support monies for each household member and proof of [facility]  residence. Usually takes about a month to get in.     [facility] [phone]   Location: [address] [facility] Clinic Hours: Walk-in Urgent Care Dental Services are offered Monday-Friday mornings only. The numbers of emergencies accepted daily is limited to the number of providers available. Maximum 15 - Mondays, Wednesdays & Thursdays Maximum 10 - Tuesdays & Fridays Services: You do not need to be a [facility] resident to be seen for a dental emergency. Emergencies are defined as pain, swelling, abnormal bleeding, or dental trauma. Walkins will receive x-rays if needed. NOTE: Dental cleaning is not an emergency. Payment Options: PAYMENT IS DUE AT THE TIME OF SERVICE. Minimum co-pay is $40.00 for uninsured patients. Minimum co-pay is $3.00 for Medicaid with dental coverage. Dental Insurance is accepted and must be presented at time of visit. Medicare does not cover dental. Forms of payment: Cash, credit card, checks. Best way to get seen: If not previously registered with the clinic, walk-in dental registration begins at 7:15 am and is on a first come/first serve basis. If previously registered with the clinic, call to make an appointment.     [facility] [phone] [place] RESIDENTS ONLY   Location: [address], [place], [place] Clinic Hours: Mon-Thu 10a-2p Services: Extractions only! Payment Options: FREE (donations accepted) - bring proof of income or support Best way to get seen: Call and schedule an appointment OR come at 8am on the 1st Monday of every month (except for holidays) when it is first come/first served.     [facility] [phone]   Location: [address] [place], [place] Clinic Hours: Friday mornings Services, Payment Options, Best way to get seen: Call for info

## 2016-05-20 NOTE — ED Triage Notes (Signed)
Says pain jaw, says he does have bad teeth.  Also into right ear.

## 2016-05-20 NOTE — ED Provider Notes (Signed)
[facility] Emergency Department Provider Note   ____________________________________________   First MD Initiated Contact with Patient 05/20/16 1321     (approximate)  I have reviewed the triage vital signs and the nursing notes.   HISTORY  Chief Complaint Dental Pain    HPI Dakota Clements is a 31 y.o. male is here with complaint of dental pain. Patient states that he "has bad teeth". He also has a metal plate in his lower jaw which increases his pain. Patient has not called a dentist or made arrangements to see one. Patient today also has elevated blood pressure and denies any history of hypertension.Patient states that his PCP is Dr. Lacie Scotts however he has not seen him in a long time. He states that over the last couple days he is tasted some stuff in his mouth that appears to be coming from the abscess. Patient continues to smoke cigarettes daily.   Past Medical History:  Diagnosis Date  . Broken jaw (HCC)   . Seizures (HCC)   . Small bowel obstruction    When he was a baby    There are no active problems to display for this patient.   Past Surgical History:  Procedure Laterality Date  . MANDIBLE FRACTURE SURGERY      Prior to Admission medications   Medication Sig Start Date End Date Taking? Authorizing Provider  clindamycin (CLEOCIN) 150 MG capsule Take 2 capsules every 6 hours 05/20/16   Tommi Rumps, PA-C  ibuprofen (ADVIL,MOTRIN) 600 MG tablet Take 1 tablet (600 mg total) by mouth every 8 (eight) hours as needed. 05/20/16   Tommi Rumps, PA-C    Allergies Dilantin [phenytoin sodium extended]  No family history on file.  Social History Social History  Substance Use Topics  . Smoking status: Current Every Day Smoker    Packs/day: 0.50    Types: Cigarettes  . Smokeless tobacco: Never Used  . Alcohol use Yes     Comment: Socially    Review of Systems Constitutional: No fever/chills Eyes: No visual  changes. ENT: Positive dental pain. The right ear pain. Positive previous mandibular fracture. Cardiovascular: Denies chest pain. Respiratory: Denies shortness of breath. Gastrointestinal:   No nausea, no vomiting.  Neurological: Negative for headaches  10-point ROS otherwise negative.  ____________________________________________   PHYSICAL EXAM:  VITAL SIGNS: ED Triage Vitals  Enc Vitals Group     BP 05/20/16 1104 (!) 133/95     Pulse Rate 05/20/16 1104 71     Resp 05/20/16 1104 16     Temp 05/20/16 1104 98 F (36.7 C)     Temp Source 05/20/16 1104 Oral     SpO2 05/20/16 1104 100 %     Weight 05/20/16 1105 140 lb (63.5 kg)     Height 05/20/16 1105 5\' 10"  (1.778 m)     Head Circumference --      Peak Flow --      Pain Score 05/20/16 1104 10     Pain Loc --      Pain Edu? --      Excl. in GC? --     Constitutional: Alert and oriented. Well appearing and in no acute distress. Eyes: Conjunctivae are normal. PERRL. EOMI. Head: Atraumatic. Nose: No congestion/rhinnorhea. Mouth/Throat: Mucous membranes are moist.  Oropharynx non-erythematous. On examination of the patient's teeth they are moderately tender to palpation with a tongue depressor. There is moderate swelling of the gum on the left lower molars. There is  no obvious abscess seen. There is no drainage present. Neck: No stridor.   Hematological/Lymphatic/Immunilogical: No cervical lymphadenopathy. Cardiovascular: Normal rate, regular rhythm. Grossly normal heart sounds.  Good peripheral circulation. Respiratory: Normal respiratory effort.  No retractions. Lungs CTAB. Musculoskeletal: Moves upper and lower extremities without any difficulty. Normal gait was noted. Neurologic:  Normal speech and language. No gross focal neurologic deficits are appreciated. No gait instability. Skin:  Skin is warm, dry and intact. No rash noted. Psychiatric: Mood and affect are normal. Speech and behavior are  normal.  ____________________________________________   LABS (all labs ordered are listed, but only abnormal results are displayed)  Labs Reviewed - No data to display _  PROCEDURES  Procedure(s) performed: None  Procedures  Critical Care performed: No  ____________________________________________   INITIAL IMPRESSION / ASSESSMENT AND PLAN / ED COURSE  Pertinent labs & imaging results that were available during my care of the patient were reviewed by me and considered in my medical decision making (see chart for details).  Patient has chronic-appearing dental caries. There is moderate tenderness on palpation of the lower left gum line. Patient was given a prescription for clindamycin 150 mg 2 capsules every 6 hours for the next 7 days and ibuprofen 600 mg 3 times a day with food. Patient is to contact a dental clinic. He was given a list of dental clinics to contact. He also is to make a plan with his primary care doctor for any continued problems and also should he wish to discontinue smoking.      ____________________________________________   FINAL CLINICAL IMPRESSION(S) / ED DIAGNOSES  Final diagnoses:  Dental abscess  Dental caries      NEW MEDICATIONS STARTED DURING THIS VISIT:  Discharge Medication List as of 05/20/2016  1:54 PM    START taking these medications   Details  clindamycin (CLEOCIN) 150 MG capsule Take 2 capsules every 6 hours, Print         Note:  This document was prepared using Dragon voice recognition software and may include unintentional dictation errors.    Tommi RumpsRhonda L Summers, PA-C 05/20/16 1538    Emily FilbertJonathan E Williams, MD 05/21/16 [phone]

## 2016-09-04 ENCOUNTER — Emergency Department
Admission: EM | Admit: 2016-09-04 | Discharge: 2016-09-04 | Disposition: A | Discharge status: Home / Self Care | Payer: Medicaid Other | Attending: Emergency Medicine | Admitting: Emergency Medicine

## 2016-09-04 ENCOUNTER — Emergency Department: Payer: Medicaid Other

## 2016-09-04 DIAGNOSIS — F17210 Nicotine dependence, cigarettes, uncomplicated: Secondary | ICD-10-CM | POA: Insufficient documentation

## 2016-09-04 DIAGNOSIS — R569 Unspecified convulsions: Secondary | ICD-10-CM

## 2016-09-04 DIAGNOSIS — F1210 Cannabis abuse, uncomplicated: Secondary | ICD-10-CM

## 2016-09-04 DIAGNOSIS — F1410 Cocaine abuse, uncomplicated: Secondary | ICD-10-CM | POA: Diagnosis not present

## 2016-09-04 LAB — CBC WITH DIFFERENTIAL/PLATELET
BASOS ABS: 0.1 10*3/uL (ref 0–0.1)
BASOS PCT: 1 %
EOS PCT: 3 %
Eosinophils Absolute: 0.2 10*3/uL (ref 0–0.7)
HEMATOCRIT: 38.5 % — AB (ref 40.0–52.0)
Hemoglobin: 13.4 g/dL (ref 13.0–18.0)
Lymphocytes Relative: 30 %
Lymphs Abs: 2.5 10*3/uL (ref 1.0–3.6)
MCH: 31.7 pg (ref 26.0–34.0)
MCHC: 34.8 g/dL (ref 32.0–36.0)
MCV: 90.8 fL (ref 80.0–100.0)
MONO ABS: 0.7 10*3/uL (ref 0.2–1.0)
Monocytes Relative: 8 %
NEUTROS ABS: 4.8 10*3/uL (ref 1.4–6.5)
Neutrophils Relative %: 58 %
PLATELETS: 246 10*3/uL (ref 150–440)
RBC: 4.23 MIL/uL — ABNORMAL LOW (ref 4.40–5.90)
RDW: 13.2 % (ref 11.5–14.5)
WBC: 8.3 10*3/uL (ref 3.8–10.6)

## 2016-09-04 LAB — URINE DRUG SCREEN, QUALITATIVE (ARMC ONLY)
Amphetamines, Ur Screen: NOT DETECTED
BARBITURATES, UR SCREEN: NOT DETECTED
BENZODIAZEPINE, UR SCRN: POSITIVE — AB
Cannabinoid 50 Ng, Ur ~~LOC~~: POSITIVE — AB
Cocaine Metabolite,Ur ~~LOC~~: POSITIVE — AB
MDMA (Ecstasy)Ur Screen: NOT DETECTED
METHADONE SCREEN, URINE: NOT DETECTED
Opiate, Ur Screen: POSITIVE — AB
Phencyclidine (PCP) Ur S: NOT DETECTED
TRICYCLIC, UR SCREEN: NOT DETECTED

## 2016-09-04 LAB — BASIC METABOLIC PANEL
ANION GAP: 9 (ref 5–15)
BUN: 11 mg/dL (ref 6–20)
CALCIUM: 9.1 mg/dL (ref 8.9–10.3)
CO2: 23 mmol/L (ref 22–32)
Chloride: 106 mmol/L (ref 101–111)
Creatinine, Ser: 1.19 mg/dL (ref 0.61–1.24)
Glucose, Bld: 76 mg/dL (ref 65–99)
Potassium: 4.3 mmol/L (ref 3.5–5.1)
Sodium: 138 mmol/L (ref 135–145)

## 2016-09-04 LAB — ETHANOL: Alcohol, Ethyl (B): 11 mg/dL — ABNORMAL HIGH (ref <5)

## 2016-09-04 LAB — URINALYSIS, COMPLETE (UACMP) WITH MICROSCOPIC
BILIRUBIN URINE: NEGATIVE
Bacteria, UA: NONE SEEN
GLUCOSE, UA: NEGATIVE mg/dL
Hgb urine dipstick: NEGATIVE
KETONES UR: NEGATIVE mg/dL
Leukocytes, UA: NEGATIVE
NITRITE: NEGATIVE
PH: 6 (ref 5.0–8.0)
Protein, ur: NEGATIVE mg/dL
SPECIFIC GRAVITY, URINE: 1.011 (ref 1.005–1.030)
Squamous Epithelial / LPF: NONE SEEN

## 2016-09-04 LAB — SALICYLATE LEVEL: SALICYLATE LVL: 9.8 mg/dL (ref 2.8–30.0)

## 2016-09-04 LAB — GLUCOSE, CAPILLARY: Glucose-Capillary: 88 mg/dL (ref 65–99)

## 2016-09-04 LAB — ACETAMINOPHEN LEVEL: Acetaminophen (Tylenol), Serum: <10 ug/mL — ABNORMAL LOW (ref 10–30)

## 2016-09-04 MED ORDER — SODIUM CHLORIDE 0.9 % IV SOLN
1000.0000 mg | Freq: Once | INTRAVENOUS | Status: AC
  Administered 2016-09-04: 1000 mg via INTRAVENOUS
  Filled 2016-09-04: qty 10

## 2016-09-04 MED ORDER — LEVETIRACETAM 500 MG PO TABS: 500.0000 mg | ORAL_TABLET | Freq: Two times a day (BID) | ORAL | 0 refills | Status: DC

## 2016-09-04 MED ORDER — SODIUM CHLORIDE 0.9 % IV BOLUS (SEPSIS)
1000.0000 mL | Freq: Once | INTRAVENOUS | Status: AC
  Administered 2016-09-04: 1000 mL via INTRAVENOUS

## 2016-09-04 NOTE — ED Notes (Addendum)
Pt resting.

## 2016-09-04 NOTE — ED Triage Notes (Signed)
Pt arrived via EMS from home with a suspected seizure activity. He was found by his wife non-verbal with hands balled up. EMS stated that his wife stated that he should not be given Dilantin for his seizure because he had "reaction" to it. VS per EMS were BG-100 BP-131/90 HR-70 and NSR on monitor. EMS reported that he "bradys down". EMS gave him 2mg  of Versed. EMS gave him an 18G in the Left Hand. Wife reported that the family has had a stomach bug for a few days so the pt hasn't really been eating much. Pt has a Hx of epilepsy per EMS.

## 2016-09-04 NOTE — Discharge Instructions (Signed)
1. Restart Keppra 500 mg twice daily (#60). 2. Avoid alcohol and drugs as these will increase your seizures. 3. Return to the ER for worsening symptoms, persistent vomiting, difficulty breathing or other concerns.

## 2016-09-04 NOTE — ED Notes (Signed)
Blood sugar 88

## 2016-09-04 NOTE — ED Provider Notes (Signed)
[facility] Emergency Department Provider Note   ____________________________________________   First MD Initiated Contact with Patient 09/04/16 [phone]     (approximate)  I have reviewed the triage vital signs and the nursing notes.   HISTORY  Chief Complaint Seizures  History limited by patient seemingly postictal and nonverbal  HPI Dakota Clements is a 31 y.o. male brought to the ED from home via EMS for seizure-like activity. Per EMS who obtained history from patient's wife, she found him in the bed with hands balled up and nonverbal. Reports history of epilepsy but is noncompliant with medications. Wife told EMS patient is allergic to Dilantin because he had a "reaction" to it previously. Patient was given 2 mg IV Versed per EMS prior to arrival. He reportedly has been experiencing nausea/vomiting and diarrhea for the past several days; the entire household has had similar symptoms. Rest of history is unobtainable secondary to patient's nonverbal state.   Past Medical History:  Diagnosis Date  . Broken jaw (HCC)   . Seizures (HCC)   . Small bowel obstruction    When he was a baby    There are no active problems to display for this patient.   Past Surgical History:  Procedure Laterality Date  . MANDIBLE FRACTURE SURGERY      Prior to Admission medications   Medication Sig Start Date End Date Taking? Authorizing Provider  clindamycin (CLEOCIN) 150 MG capsule Take 2 capsules every 6 hours 05/20/16   Bridget HartshornSummers, Rhonda L, PA-C  ibuprofen (ADVIL,MOTRIN) 600 MG tablet Take 1 tablet (600 mg total) by mouth every 8 (eight) hours as needed. 05/20/16   Tommi RumpsSummers, Rhonda L, PA-C  levETIRAcetam (KEPPRA) 500 MG tablet Take 1 tablet (500 mg total) by mouth 2 (two) times daily. 09/04/16   Irean HongSung, Fredis Malkiewicz J, MD    Allergies Dilantin [phenytoin sodium extended]  No family history on file.  Social History Social History  Substance Use Topics  . Smoking  status: Current Every Day Smoker    Packs/day: 0.50    Types: Cigarettes  . Smokeless tobacco: Never Used  . Alcohol use Yes     Comment: Socially    Review of Systems  Constitutional: No fever/chills. Eyes: No visual changes. ENT: No sore throat. Cardiovascular: Denies chest pain. Respiratory: Denies shortness of breath. Gastrointestinal: No abdominal pain.  Positive for nausea, vomiting and diarrhea.  No constipation. Genitourinary: Negative for dysuria. Musculoskeletal: Negative for back pain. Skin: Negative for rash. Neurological: Positive for seizure-like activity. Negative for headaches, focal weakness or numbness.   ____________________________________________   PHYSICAL EXAM:  VITAL SIGNS: ED Triage Vitals [09/04/16 0423]  Enc Vitals Group     BP 113/76     Pulse Rate (!) 53     Resp (!) 25     Temp 98.8 F (37.1 C)     Temp Source Oral     SpO2 98 %     Weight      Height      Head Circumference      Peak Flow      Pain Score      Pain Loc      Pain Edu?      Excl. in GC?     Constitutional: Alert, eyes spontaneously open. Dirty appearing and in no acute distress. Eyes: Conjunctivae are normal. PERRL. EOMI. Head: Atraumatic. Nose: No congestion/rhinnorhea. Mouth/Throat: Widespread dental caries. No tongue abrasion or laceration. Mucous membranes are moist.  Oropharynx non-erythematous. Neck: No  stridor.  No cervical spine tenderness to palpation.  Supple neck without meningismus. Cardiovascular: Normal rate, regular rhythm. Grossly normal heart sounds.  Good peripheral circulation. Respiratory: Normal respiratory effort.  No retractions. Lungs CTAB. Gastrointestinal: Soft and nontender. No distention. No abdominal bruits. No CVA tenderness. GU: No evidence for urinary incontinence. Musculoskeletal: No lower extremity tenderness nor edema.  No joint effusions. Neurologic:  Eyes open, tracks this examiner. Nonverbal. Asleep upon my arrival to the  treatment room. After awakening, patient appears to have voluntary shaking of his right upper and lower extremity. No gross focal neurologic deficits are appreciated. MAEx4. Skin:  Skin is warm, dry and intact. No rash noted. No petechiae. Psychiatric: Mood and affect are normal.  ____________________________________________   LABS (all labs ordered are listed, but only abnormal results are displayed)  Labs Reviewed  CBC WITH DIFFERENTIAL/PLATELET - Abnormal; Notable for the following:       Result Value   RBC 4.23 (*)    HCT 38.5 (*)    All other components within normal limits  ETHANOL - Abnormal; Notable for the following:    Alcohol, Ethyl (B) 11 (*)    All other components within normal limits  URINE DRUG SCREEN, QUALITATIVE ([facility] ONLY) - Abnormal; Notable for the following:    Cocaine Metabolite,Ur [place] POSITIVE (*)    Opiate, Ur Screen POSITIVE (*)    Cannabinoid 50 Ng, Ur  POSITIVE (*)    Benzodiazepine, Ur Scrn POSITIVE (*)    All other components within normal limits  ACETAMINOPHEN LEVEL - Abnormal; Notable for the following:    Acetaminophen (Tylenol), Serum <10 (*)    All other components within normal limits  URINALYSIS, COMPLETE (UACMP) WITH MICROSCOPIC - Abnormal; Notable for the following:    Color, Urine YELLOW (*)    APPearance CLEAR (*)    All other components within normal limits  BASIC METABOLIC PANEL  SALICYLATE LEVEL  GLUCOSE, CAPILLARY  CBG MONITORING, ED   ____________________________________________  EKG  ED ECG REPORT I, Tony Friscia J, the attending physician, personally viewed and interpreted this ECG.   Date: 09/04/2016  EKG Time: 0420  Rate: 52  Rhythm: sinus bradycardia  Axis: Normal  Intervals:none  ST&T Change: Nonspecific  ____________________________________________  RADIOLOGY  Ct Head Wo Contrast  Result Date: 09/04/2016 CLINICAL DATA:  31 year old male with seizure activity. EXAM: CT HEAD WITHOUT CONTRAST TECHNIQUE: Contiguous  axial images were obtained from the base of the skull through the vertex without intravenous contrast. COMPARISON:  Head CT dated 07/27/2014 FINDINGS: Brain: The ventricles and sulci appropriate size for patient's age. The gray-white matter discrimination is preserved. There is no acute intracranial hemorrhage. No mass effect or midline shift. Small stable CSF density structure in the posterior fossa may represent an arachnoid cyst versus a dilated cisterna magna. Vascular: No hyperdense vessel or unexpected calcification. Skull: Normal. Negative for fracture or focal lesion. Sinuses/Orbits: No acute finding. Other: None IMPRESSION: No acute intracranial pathology. Electronically Signed   By: Elgie Collard M.D.   On: 09/04/2016 05:24    ____________________________________________   PROCEDURES  Procedure(s) performed: None  Procedures  Critical Care performed: No  ____________________________________________   INITIAL IMPRESSION / ASSESSMENT AND PLAN / ED COURSE  Pertinent labs & imaging results that were available during my care of the patient were reviewed by me and considered in my medical decision making (see chart for details).  31 year old male who presents with seizure-like activity. Reviewed prior medical records which indicate patient has a history of epilepsy  but does not take medications. This dates back previously to 04/15/2013, one patient was hospitalized to the behavioral medicine unit for depression and polysubstance abuse including cocaine. Question pseudoseizure as patient voluntarily shakes his right upper and lower extremity upon my arrival to the treatment room. This stopped when I placed a warm blanket over the patient. Question pseudoseizure. Will check screening lab work including toxicological screening, obtain CT head and reassess.  Clinical Course as of Sep 04 656  Thu Sep 04, 2016  0532 Patient now talking with clear speech. Hungry, asking for food. Will hold  until results of CT head.  [JS]  [phone] Updated patient of laboratory, urinalysis and imaging results. Patient tells me he was supposed to be on Keppra but could not afford it. This was several years ago when he did not have Medicaid. Now he has Medicaid. Will infuse IV Keppra in the ER and discharge patient home on oral Keppra. Will give sandwich tray. Anticipate discharge home once IV Keppra has completed.  [JS]  [id number] Change of shift to Dr. Lamont Snowball. Patient eating; IV Keppra infusing. Will discharge home once Keppra is completed.   [JS]    Clinical Course User Index [JS] Irean Hong, MD     ____________________________________________   FINAL CLINICAL IMPRESSION(S) / ED DIAGNOSES  Final diagnoses:  Seizure (HCC)  Cocaine abuse  Cannabis abuse      NEW MEDICATIONS STARTED DURING THIS VISIT:  New Prescriptions   LEVETIRACETAM (KEPPRA) 500 MG TABLET    Take 1 tablet (500 mg total) by mouth 2 (two) times daily.     Note:  This document was prepared using Dragon voice recognition software and may include unintentional dictation errors.    Irean Hong, MD 09/04/16 [phone]

## 2018-01-14 ENCOUNTER — Other Ambulatory Visit: Payer: Self-pay

## 2018-01-14 ENCOUNTER — Emergency Department
Admission: EM | Admit: 2018-01-14 | Discharge: 2018-01-14 | Disposition: A | Discharge status: Home / Self Care | Payer: Medicaid Other | Attending: Emergency Medicine | Admitting: Emergency Medicine

## 2018-01-14 ENCOUNTER — Encounter: Payer: Self-pay | Admitting: Emergency Medicine

## 2018-01-14 DIAGNOSIS — S41011A Laceration without foreign body of right shoulder, initial encounter: Secondary | ICD-10-CM | POA: Diagnosis not present

## 2018-01-14 DIAGNOSIS — Z79899 Other long term (current) drug therapy: Secondary | ICD-10-CM | POA: Insufficient documentation

## 2018-01-14 DIAGNOSIS — F17210 Nicotine dependence, cigarettes, uncomplicated: Secondary | ICD-10-CM | POA: Insufficient documentation

## 2018-01-14 DIAGNOSIS — Y998 Other external cause status: Secondary | ICD-10-CM | POA: Diagnosis not present

## 2018-01-14 DIAGNOSIS — Y92524 Gas station as the place of occurrence of the external cause: Secondary | ICD-10-CM | POA: Insufficient documentation

## 2018-01-14 DIAGNOSIS — T148XXA Other injury of unspecified body region, initial encounter: Secondary | ICD-10-CM

## 2018-01-14 DIAGNOSIS — S41131A Puncture wound without foreign body of right upper arm, initial encounter: Secondary | ICD-10-CM | POA: Diagnosis present

## 2018-01-14 DIAGNOSIS — Y9389 Activity, other specified: Secondary | ICD-10-CM | POA: Diagnosis not present

## 2018-01-14 MED ORDER — HYDROCODONE-ACETAMINOPHEN 5-325 MG PO TABS
1.0000 | ORAL_TABLET | Freq: Once | ORAL | Status: AC
  Administered 2018-01-14: 1 via ORAL
  Filled 2018-01-14: qty 1

## 2018-01-14 MED ORDER — TETANUS-DIPHTH-ACELL PERTUSSIS 5-2.5-18.5 LF-MCG/0.5 IM SUSP: 0.5000 mL | Freq: Once | INTRAMUSCULAR | Status: DC

## 2018-01-14 MED ORDER — HYDROCODONE-ACETAMINOPHEN 5-325 MG PO TABS: 1.0000 | ORAL_TABLET | Freq: Three times a day (TID) | ORAL | 0 refills | Status: DC | PRN

## 2018-01-14 MED ORDER — HYDROCODONE-ACETAMINOPHEN 5-325 MG PO TABS: 1.0000 | ORAL_TABLET | Freq: Three times a day (TID) | ORAL | 0 refills | Status: AC | PRN
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
## 2018-01-14 NOTE — Discharge Instructions (Addendum)
Your wound has been cleansed and dressed with steri-strips. Keep the wound clean and dry. Take the pain medicine as needed. Take OTC ibuprofen for additional pain relief. Follow-up with your provider for ongoing symptoms. Apply ice or moist heat for muscle soreness. Return to the ED for signs of infection.
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
## 2018-07-06 ENCOUNTER — Emergency Department
Admission: EM | Admit: 2018-07-06 | Discharge: 2018-07-06 | Disposition: A | Discharge status: Home / Self Care | Payer: Medicaid Other | Attending: Emergency Medicine | Admitting: Emergency Medicine

## 2018-07-06 ENCOUNTER — Other Ambulatory Visit: Payer: Self-pay

## 2018-07-06 ENCOUNTER — Emergency Department: Payer: Medicaid Other

## 2018-07-06 DIAGNOSIS — L03211 Cellulitis of face: Secondary | ICD-10-CM | POA: Diagnosis not present

## 2018-07-06 DIAGNOSIS — R220 Localized swelling, mass and lump, head: Secondary | ICD-10-CM | POA: Diagnosis present

## 2018-07-06 DIAGNOSIS — F17210 Nicotine dependence, cigarettes, uncomplicated: Secondary | ICD-10-CM | POA: Insufficient documentation

## 2018-07-06 LAB — CBC WITH DIFFERENTIAL/PLATELET
Abs Immature Granulocytes: 0.02 10*3/uL (ref 0.00–0.07)
Basophils Absolute: 0.1 10*3/uL (ref 0.0–0.1)
Basophils Relative: 1 %
Eosinophils Absolute: 0.4 10*3/uL (ref 0.0–0.5)
Eosinophils Relative: 4 %
HCT: 39.1 % (ref 39.0–52.0)
Hemoglobin: 12.8 g/dL — ABNORMAL LOW (ref 13.0–17.0)
Immature Granulocytes: 0 %
Lymphocytes Relative: 15 %
Lymphs Abs: 1.5 10*3/uL (ref 0.7–4.0)
MCH: 29.4 pg (ref 26.0–34.0)
MCHC: 32.7 g/dL (ref 30.0–36.0)
MCV: 89.7 fL (ref 80.0–100.0)
Monocytes Absolute: 1.1 10*3/uL — ABNORMAL HIGH (ref 0.1–1.0)
Monocytes Relative: 11 %
Neutro Abs: 7 10*3/uL (ref 1.7–7.7)
Neutrophils Relative %: 69 %
Platelets: 297 10*3/uL (ref 150–400)
RBC: 4.36 MIL/uL (ref 4.22–5.81)
RDW: 14.2 % (ref 11.5–15.5)
WBC: 10 10*3/uL (ref 4.0–10.5)
nRBC: 0 % (ref 0.0–0.2)

## 2018-07-06 LAB — COMPREHENSIVE METABOLIC PANEL
ALT: 22 U/L (ref 0–44)
AST: 24 U/L (ref 15–41)
Albumin: 4 g/dL (ref 3.5–5.0)
Alkaline Phosphatase: 50 U/L (ref 38–126)
Anion gap: 6 (ref 5–15)
BUN: 22 mg/dL — ABNORMAL HIGH (ref 6–20)
CO2: 24 mmol/L (ref 22–32)
Calcium: 9.1 mg/dL (ref 8.9–10.3)
Chloride: 108 mmol/L (ref 98–111)
Creatinine, Ser: 1.13 mg/dL (ref 0.61–1.24)
GFR calc Af Amer: >60 mL/min (ref >60)
GFR calc non Af Amer: >60 mL/min (ref >60)
Glucose, Bld: 92 mg/dL (ref 70–99)
Potassium: 4.1 mmol/L (ref 3.5–5.1)
Sodium: 138 mmol/L (ref 135–145)
Total Bilirubin: 0.6 mg/dL (ref 0.3–1.2)
Total Protein: 7 g/dL (ref 6.5–8.1)

## 2018-07-06 MED ORDER — SODIUM CHLORIDE 0.9 % IV BOLUS
1000.0000 mL | Freq: Once | INTRAVENOUS | Status: AC
  Administered 2018-07-06: 1000 mL via INTRAVENOUS

## 2018-07-06 MED ORDER — CLINDAMYCIN HCL 300 MG PO CAPS: 300.0000 mg | ORAL_CAPSULE | Freq: Four times a day (QID) | ORAL | 0 refills | Status: AC

## 2018-07-06 MED ORDER — IOHEXOL 300 MG/ML  SOLN
75.0000 mL | Freq: Once | INTRAMUSCULAR | Status: AC | PRN
  Administered 2018-07-06: 75 mL via INTRAVENOUS
  Filled 2018-07-06: qty 75

## 2018-07-06 MED ORDER — CLINDAMYCIN PHOSPHATE 600 MG/50ML IV SOLN
600.0000 mg | Freq: Once | INTRAVENOUS | Status: AC
  Administered 2018-07-06: 600 mg via INTRAVENOUS
  Filled 2018-07-06: qty 50

## 2018-07-06 MED ORDER — MAGIC MOUTHWASH W/LIDOCAINE: 5.0000 mL | Freq: Four times a day (QID) | ORAL | 1 refills | Status: AC

## 2018-07-06 MED ORDER — HYDROCODONE-ACETAMINOPHEN 5-325 MG PO TABS: 1.0000 | ORAL_TABLET | Freq: Four times a day (QID) | ORAL | 0 refills | Status: AC | PRN

## 2018-07-06 NOTE — ED Triage Notes (Signed)
Pt c/o pain with left lower jaw swelling for the past 4 days. Denies dental pain

## 2018-07-06 NOTE — ED Notes (Signed)
See triage note  Presents with a 4 day hx of swelling to left jaw area  States has become more painful over the past 1-2 days  Unsure of fever  States pain is moving into left ear
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
## 2019-04-15 ENCOUNTER — Encounter: Payer: Self-pay | Admitting: Intensive Care

## 2019-04-15 ENCOUNTER — Other Ambulatory Visit: Payer: Self-pay

## 2019-04-15 ENCOUNTER — Emergency Department
Admission: EM | Admit: 2019-04-15 | Discharge: 2019-04-15 | Disposition: A | Discharge status: Home / Self Care | Payer: Medicaid Other | Attending: Student in an Organized Health Care Education/Training Program | Admitting: Student in an Organized Health Care Education/Training Program

## 2019-04-15 DIAGNOSIS — Y999 Unspecified external cause status: Secondary | ICD-10-CM | POA: Diagnosis not present

## 2019-04-15 DIAGNOSIS — S025XXA Fracture of tooth (traumatic), initial encounter for closed fracture: Secondary | ICD-10-CM | POA: Diagnosis not present

## 2019-04-15 DIAGNOSIS — F17210 Nicotine dependence, cigarettes, uncomplicated: Secondary | ICD-10-CM | POA: Insufficient documentation

## 2019-04-15 DIAGNOSIS — Y9389 Activity, other specified: Secondary | ICD-10-CM | POA: Insufficient documentation

## 2019-04-15 DIAGNOSIS — Y929 Unspecified place or not applicable: Secondary | ICD-10-CM | POA: Insufficient documentation

## 2019-04-15 DIAGNOSIS — K029 Dental caries, unspecified: Secondary | ICD-10-CM | POA: Diagnosis not present

## 2019-04-15 DIAGNOSIS — K0889 Other specified disorders of teeth and supporting structures: Secondary | ICD-10-CM | POA: Diagnosis present

## 2019-04-15 DIAGNOSIS — X58XXXA Exposure to other specified factors, initial encounter: Secondary | ICD-10-CM | POA: Diagnosis not present

## 2019-04-15 MED ORDER — AMOXICILLIN 500 MG PO CAPS: 500.0000 mg | ORAL_CAPSULE | Freq: Three times a day (TID) | ORAL | 0 refills | Status: DC

## 2019-04-15 MED ORDER — OXYCODONE-ACETAMINOPHEN 7.5-325 MG PO TABS: 1.0000 | ORAL_TABLET | Freq: Four times a day (QID) | ORAL | 0 refills | Status: DC | PRN

## 2019-04-15 MED ORDER — LIDOCAINE VISCOUS HCL 2 % MT SOLN
15.0000 mL | Freq: Once | OROMUCOSAL | Status: AC
  Administered 2019-04-15: 12:00:00 15 mL via OROMUCOSAL
  Filled 2019-04-15: qty 15

## 2019-04-15 MED ORDER — CLINDAMYCIN HCL 300 MG PO CAPS: 300.0000 mg | ORAL_CAPSULE | Freq: Three times a day (TID) | ORAL | 0 refills | Status: DC

## 2019-04-15 MED ORDER — TRAMADOL HCL 50 MG PO TABS
50.0000 mg | ORAL_TABLET | Freq: Once | ORAL | Status: AC
  Administered 2019-04-15: 12:00:00 50 mg via ORAL
  Filled 2019-04-15: qty 1

## 2019-04-15 MED ORDER — IBUPROFEN 600 MG PO TABS: 600.0000 mg | ORAL_TABLET | Freq: Three times a day (TID) | ORAL | 0 refills | Status: DC | PRN

## 2019-04-15 MED ORDER — IBUPROFEN 600 MG PO TABS
600.0000 mg | ORAL_TABLET | Freq: Once | ORAL | Status: AC
  Administered 2019-04-15: 12:00:00 600 mg via ORAL
  Filled 2019-04-15: qty 1

## 2019-04-15 MED ORDER — LIDOCAINE VISCOUS HCL 2 % MT SOLN
5.0000 mL | Freq: Four times a day (QID) | OROMUCOSAL | 0 refills | Status: AC | PRN
Start: 2019-04-15 — End: ?

## 2019-04-15 NOTE — ED Triage Notes (Signed)
Patient presents with dental pain 

## 2019-04-15 NOTE — Discharge Instructions (Addendum)
Follow discharge care instruction.  Call [facility] and tell them you a follow-up from the emergency room.  Take medication as directed.

## 2019-04-15 NOTE — ED Provider Notes (Signed)
[facility] Emergency Department Provider Note   ____________________________________________   First MD Initiated Contact with Patient 04/15/19 1123     (approximate)  I have reviewed the triage vital signs and the nursing notes.   HISTORY  Chief Complaint Dental Pain    HPI Dakota Clements is a 34 y.o. male patient presents with dental pain secondary to multiple devitalized and fractured teeth.  Patient stated he called the [facility] and was told that he needed a referral from emergency room.  Patient had mandible fracture surgery approximately 10 years ago.  No dental follow-up since surgery.  Patient rates his pain as a 10/10.  Patient described the pain as "achy".  No palliative measures for complaint.         Past Medical History:  Diagnosis Date  . Broken jaw ([place])   . Seizures ([place])   . Small bowel obstruction (HCC)    When he was a baby    There are no problems to display for this patient.   Past Surgical History:  Procedure Laterality Date  . MANDIBLE FRACTURE SURGERY      Prior to Admission medications   Medication Sig Start Date End Date Taking? Authorizing Provider  clindamycin (CLEOCIN) 300 MG capsule Take 1 capsule (300 mg total) by mouth 4 (four) times daily. 07/06/18   Cuthriell, Charline Bills, PA-C  clindamycin (CLEOCIN) 300 MG capsule Take 1 capsule (300 mg total) by mouth 3 (three) times daily for 10 days. 04/15/19 04/25/19  Sable Feil, PA-C  HYDROcodone-acetaminophen (NORCO/VICODIN) 5-325 MG tablet Take 1 tablet by mouth every 6 (six) hours as needed for moderate pain. 07/06/18   Cuthriell, Charline Bills, PA-C  ibuprofen (ADVIL) 600 MG tablet Take 1 tablet (600 mg total) by mouth every 8 (eight) hours as needed. 04/15/19   Sable Feil, PA-C  lidocaine (XYLOCAINE) 2 % solution Use as directed 5 mLs in the mouth or throat every 6 (six) hours as needed for mouth pain. For oral swish. 04/15/19   Sable Feil,  PA-C  magic mouthwash w/lidocaine SOLN Take 5 mLs by mouth 4 (four) times daily. 07/06/18   Cuthriell, Charline Bills, PA-C  oxyCODONE-acetaminophen (PERCOCET) 7.5-325 MG tablet Take 1 tablet by mouth every 6 (six) hours as needed. 04/15/19   Sable Feil, PA-C    Allergies Dilantin [phenytoin sodium extended]  History reviewed. No pertinent family history.  Social History Social History   Tobacco Use  . Smoking status: Current Every Day Smoker    Packs/day: 0.50    Types: Cigarettes  . Smokeless tobacco: Never Used  Substance Use Topics  . Alcohol use: Yes    Comment: Socially  . Drug use: Yes    Types: Marijuana    Review of Systems Constitutional: No fever/chills Eyes: No visual changes. ENT: No sore throat. Cardiovascular: Denies chest pain. Respiratory: Denies shortness of breath. Gastrointestinal: No abdominal pain.  No nausea, no vomiting.  No diarrhea.  No constipation. Genitourinary: Negative for dysuria. Musculoskeletal: Negative for back pain. Skin: Negative for rash. Neurological: Negative for headaches, focal weakness or numbness. Allergic/Immunilogical: Dilantin.  ____________________________________________   PHYSICAL EXAM:  VITAL SIGNS: ED Triage Vitals [04/15/19 1120]  Enc Vitals Group     BP      Pulse      Resp      Temp      Temp src      SpO2      Weight 140 lb (63.5  kg)     Height 5\' 10"  (1.778 m)     Head Circumference      Peak Flow      Pain Score 10     Pain Loc      Pain Edu?      Excl. in GC?    Constitutional: Alert and oriented. Well appearing and in no acute distress. Mouth/Throat: Mucous membranes are moist.  Oropharynx non-erythematous.  Multiple devitalized and fractured teeth.  Mild gingival edema but no true abscess. Neck: No stridor.   Hematological/Lymphatic/Immunilogical: No cervical lymphadenopathy. Cardiovascular: Normal rate, regular rhythm. Grossly normal heart sounds.  Good peripheral circulation. Respiratory:  Normal respiratory effort.  No retractions. Lungs CTAB. Skin:  Skin is warm, dry and intact. No rash noted. Psychiatric: Mood and affect are normal. Speech and behavior are normal.  ____________________________________________   LABS (all labs ordered are listed, but only abnormal results are displayed)  Labs Reviewed - No data to display ____________________________________________  EKG   ____________________________________________  RADIOLOGY  ED MD interpretation:    Official radiology report(s): No results found.  ____________________________________________   PROCEDURES  Procedure(s) performed (including Critical Care):  Procedures   ____________________________________________   INITIAL IMPRESSION / ASSESSMENT AND PLAN / ED COURSE  As part of my medical decision making, I reviewed the following data within the electronic MEDICAL RECORD NUMBER     Dental pain secondary to multiple dental caries and fractured teeth.  Patient given discharge care instruction advised to contact [facility] for definitive evaluation and treatment.  Take medication as directed.    Dakota Clements was evaluated in Emergency Department on 04/15/2019 for the symptoms described in the history of present illness. He was evaluated in the context of the global COVID-19 pandemic, which necessitated consideration that the patient might be at risk for infection with the SARS-CoV-2 virus that causes COVID-19. Institutional protocols and algorithms that pertain to the evaluation of patients at risk for COVID-19 are in a state of rapid change based on information released by regulatory bodies including the CDC and federal and state organizations. These policies and algorithms were followed during the patient's care in the ED.       ____________________________________________   FINAL CLINICAL IMPRESSION(S) / ED DIAGNOSES  Final diagnoses:  Pain due to dental caries  Closed  fracture of tooth, initial encounter     ED Discharge Orders         Ordered    clindamycin (CLEOCIN) 300 MG capsule  3 times daily     04/15/19 1134    ibuprofen (ADVIL) 600 MG tablet  Every 8 hours PRN     04/15/19 1138    oxyCODONE-acetaminophen (PERCOCET) 7.5-325 MG tablet  Every 6 hours PRN     04/15/19 1138    lidocaine (XYLOCAINE) 2 % solution  Every 6 hours PRN     04/15/19 1138           Note:  This document was prepared using Dragon voice recognition software and may include unintentional dictation errors.    04/17/19, PA-C 04/15/19 1143    04/17/19, MD 04/15/19 [phone]

## 2019-04-15 NOTE — ED Notes (Signed)
Pt c/o dental pain and swelling that yesterday yesterday. Pt states that the pain is "excruciating". Pt noted to have swelling on right side of his face. Pt is in NAD.

## 2021-04-08 ENCOUNTER — Encounter: Payer: Self-pay | Admitting: *Deleted

## 2021-04-08 ENCOUNTER — Ambulatory Visit
Admission: EM | Admit: 2021-04-08 | Discharge: 2021-04-08 | Disposition: A | Discharge status: Home / Self Care | Payer: Medicaid Other | Attending: Internal Medicine | Admitting: Internal Medicine

## 2021-04-08 ENCOUNTER — Other Ambulatory Visit: Payer: Self-pay

## 2021-04-08 ENCOUNTER — Ambulatory Visit (INDEPENDENT_AMBULATORY_CARE_PROVIDER_SITE_OTHER): Payer: Medicaid Other

## 2021-04-08 DIAGNOSIS — S65312A Laceration of deep palmar arch of left hand, initial encounter: Secondary | ICD-10-CM | POA: Diagnosis not present

## 2021-04-08 DIAGNOSIS — S61412A Laceration without foreign body of left hand, initial encounter: Secondary | ICD-10-CM

## 2021-04-08 MED ORDER — CEPHALEXIN 500 MG PO CAPS: 1000.0000 mg | ORAL_CAPSULE | Freq: Two times a day (BID) | ORAL | 0 refills | Status: AC

## 2021-04-08 MED ORDER — TETANUS-DIPHTH-ACELL PERTUSSIS 5-2.5-18.5 LF-MCG/0.5 IM SUSY
0.5000 mL | PREFILLED_SYRINGE | Freq: Once | INTRAMUSCULAR | Status: AC
  Administered 2021-04-08: 0.5 mL via INTRAMUSCULAR

## 2021-04-08 NOTE — ED Provider Notes (Addendum)
[facility]    CSN: [id number] Arrival date & time: 04/08/21  1330      History   Chief Complaint Chief Complaint  Patient presents with   Laceration    HPI Dakota Clements is a 36 y.o. male who presents with L hand laceration which occurred about 1h ago while at work. He was trying to cut a tab off a screw driver using his pocket knife with the R hand and cut his L web space between his index finger and thumb. He feels he his the bone on his index knuckle where it feels numb to him. He does not know when his last TD was.  He showed me his knife and the tip is intact.    Past Medical History:  Diagnosis Date   Broken jaw (HCC)    Seizures (HCC)    Small bowel obstruction (HCC)    When he was a baby    There are no problems to display for this patient.   Past Surgical History:  Procedure Laterality Date   MANDIBLE FRACTURE SURGERY         Home Medications    Prior to Admission medications   Medication Sig Start Date End Date Taking? Authorizing Provider  cephALEXin (KEFLEX) 500 MG capsule Take 2 capsules (1,000 mg total) by mouth 2 (two) times daily for 5 days. 04/08/21 04/13/21 Yes Rodriguez-Southworth, Nettie Elm, PA-C  clindamycin (CLEOCIN) 300 MG capsule Take 1 capsule (300 mg total) by mouth 4 (four) times daily. 07/06/18   Cuthriell, Delorise Royals, PA-C  HYDROcodone-acetaminophen (NORCO/VICODIN) 5-325 MG tablet Take 1 tablet by mouth every 6 (six) hours as needed for moderate pain. 07/06/18   Cuthriell, Delorise Royals, PA-C  ibuprofen (ADVIL) 600 MG tablet Take 1 tablet (600 mg total) by mouth every 8 (eight) hours as needed. 04/15/19   Joni Reining, PA-C  lidocaine (XYLOCAINE) 2 % solution Use as directed 5 mLs in the mouth or throat every 6 (six) hours as needed for mouth pain. For oral swish. 04/15/19   Joni Reining, PA-C  magic mouthwash w/lidocaine SOLN Take 5 mLs by mouth 4 (four) times daily. 07/06/18   Cuthriell, Delorise Royals, PA-C   oxyCODONE-acetaminophen (PERCOCET) 7.5-325 MG tablet Take 1 tablet by mouth every 6 (six) hours as needed. 04/15/19   Joni Reining, PA-C    Family History History reviewed. No pertinent family history.  Social History Social History   Tobacco Use   Smoking status: Every Day    Packs/day: 0.50    Types: Cigarettes   Smokeless tobacco: Never  Substance Use Topics   Alcohol use: Yes    Comment: Socially   Drug use: Yes    Types: Marijuana     Allergies   Dilantin [phenytoin sodium extended]   Review of Systems Review of Systems  Skin:  Positive for wound. Negative for color change, pallor and rash.  Neurological:  Positive for numbness. Negative for weakness.    Physical Exam Triage Vital Signs ED Triage Vitals  Enc Vitals Group     BP 04/08/21 1435 123/85     Pulse Rate 04/08/21 1435 (!) 52     Resp 04/08/21 1435 16     Temp 04/08/21 1435 98.5 F (36.9 C)     Temp src --      SpO2 04/08/21 1435 98 %     Weight --      Height --      Head Circumference --  Peak Flow --      Pain Score 04/08/21 1431 5     Pain Loc --      Pain Edu? --      Excl. in [place]? --    No data found.  Updated Vital Signs BP 123/85    Pulse (!) 52    Temp 98.5 F (36.9 C)    Resp 16    SpO2 98%   Visual Acuity Right Eye Distance:   Left Eye Distance:   Bilateral Distance:    Right Eye Near:   Left Eye Near:    Bilateral Near:     Physical Exam Vitals and nursing note reviewed.  Constitutional:      General: He is not in acute distress. HENT:     Right Ear: External ear normal.     Left Ear: External ear normal.  Eyes:     General: No scleral icterus.    Conjunctiva/sclera: Conjunctivae normal.  Pulmonary:     Effort: Pulmonary effort is normal.  Musculoskeletal:        General: Normal range of motion.     Cervical back: Neck supple.     Comments: His index knuckle is a little swollen and tender medially, but there is no ecchymosis. Has decreased sensation to  light touch on this area.   Skin:    Capillary Refill: Capillary refill takes less than 2 seconds.     Comments: L HAND- Has a 1.5 cm laceration with controlled bleeding on the dorsal web space between his index and thumb. ROM of all his fingers are normal. Grip strength 5/5  Neurological:     Mental Status: He is alert and oriented to person, place, and time.     Gait: Gait normal.  Psychiatric:        Mood and Affect: Mood normal.        Behavior: Behavior normal.        Thought Content: Thought content normal.        Judgment: Judgment normal.     UC Treatments / Results  Labs (all labs ordered are listed, but only abnormal results are displayed) Labs Reviewed - No data to display  EKG   Radiology DG Hand Complete Left  Result Date: 04/08/2021 CLINICAL DATA:  Left hand laceration and pain after injury today. EXAM: LEFT HAND - COMPLETE 3+ VIEW COMPARISON:  January 18, 2016. FINDINGS: There is no evidence of fracture or dislocation. There is no evidence of arthropathy or other focal bone abnormality. Soft tissues are unremarkable. IMPRESSION: Negative. Electronically Signed   By: Lupita Raider M.D.   On: 04/08/2021 15:05    Procedures Laceration Repair  Date/Time: 04/08/2021 3:37 PM Performed by: Garey Ham, PA-C Authorized by: Garey Ham, PA-C   Consent:    Consent obtained:  Verbal   Consent given by:  Patient   Risks discussed:  Infection and nerve damage Universal protocol:    Procedure explained and questions answered to patient or proxy's satisfaction: yes   Anesthesia:    Anesthesia method:  None Laceration details:    Location:  Hand   Hand location:  L hand, dorsum   Length (cm):  1.5   Depth (mm):  4 Pre-procedure details:    Preparation:  Imaging obtained to evaluate for foreign bodies (and to look at the bone) Exploration:    Contaminated: no   Treatment:    Area cleansed with:  Saline and Shur-Clens   Amount of  cleaning:  Extensive   Irrigation solution:  Sterile saline   Irrigation volume:  500 ml   Irrigation method:  Tap   Visualized foreign bodies/material removed: no     Debridement:  None Skin repair:    Repair method:  Tissue adhesive Approximation:    Approximation:  Close Post-procedure details:    Dressing:  Bulky dressing   Procedure completion:  Tolerated with difficulty Comments:     Had a lot of stinging when rinsed with saline The skin approximated well with the skin glu, and had him open and close his hands and fingers to see if the laceration would split, but did not. I rolled gauze to pul on his palm while I applied Telfa over the dried glue and wrapped it with coban.  (including critical care time)  Medications Ordered in UC Medications  Tdap (BOOSTRIX) injection 0.5 mL (0.5 mLs Intramuscular Given 04/08/21 1524)    Initial Impression / Assessment and Plan / UC Course  I have reviewed the triage vital signs and the nursing notes. Pertinent  imaging results that were available during my care of the patient were reviewed by me and considered in my medical decision making (see chart for details). TDAP was given Laceration L hand Wound care instructions explained. See instructions. Needs to FU with ortho as noted.  May take Tylenol today prn pain, and Ibuprofen starting tomorrow.  I placed him on Keflex for prevention of infection as noted.     Final Clinical Impressions(s) / UC Diagnoses   Final diagnoses:  Laceration of deep palmar arch of left hand, initial encounter     Discharge Instructions      Keep the hand dry for 4 days, and remove the bandage to check on the wound. Watch out for signs of infection like redness and worse pain.After that you may take quick showers, the glue will fall out on its own Follow up with orthopedics this week.  For prevention of infection I sent an antibiotic to your pharmacy    ED Prescriptions     Medication Sig Dispense  Auth. Provider   cephALEXin (KEFLEX) 500 MG capsule Take 2 capsules (1,000 mg total) by mouth 2 (two) times daily for 5 days. 20 capsule Rodriguez-Southworth, Nettie Elm, PA-C      PDMP not reviewed this encounter.   Garey Ham, PA-C 04/08/21 1543    Rodriguez-Southworth, Nettie Elm, PA-C 04/08/21 1543

## 2021-04-08 NOTE — ED Triage Notes (Signed)
Pt reports a lac between the index finger and thumb. Pt cut himself with a knife today. Bleeding controlled on arrival to [facility].

## 2021-04-08 NOTE — Discharge Instructions (Addendum)
Keep the hand dry for 4 days, and remove the bandage to check on the wound. Watch out for signs of infection like redness and worse pain.After that you may take quick showers, the glue will fall out on its own Follow up with orthopedics this week.  For prevention of infection I sent an antibiotic to your pharmacy

## 2021-11-17 ENCOUNTER — Emergency Department
Admission: EM | Admit: 2021-11-17 | Discharge: 2021-11-17 | Disposition: A | Discharge status: Home / Self Care | Payer: Medicaid Other | Attending: Emergency Medicine | Admitting: Emergency Medicine

## 2021-11-17 ENCOUNTER — Other Ambulatory Visit: Payer: Self-pay

## 2021-11-17 ENCOUNTER — Encounter: Payer: Self-pay | Admitting: Emergency Medicine

## 2021-11-17 DIAGNOSIS — S01511A Laceration without foreign body of lip, initial encounter: Secondary | ICD-10-CM | POA: Insufficient documentation

## 2021-11-17 DIAGNOSIS — W2209XA Striking against other stationary object, initial encounter: Secondary | ICD-10-CM | POA: Insufficient documentation

## 2021-11-17 DIAGNOSIS — S0993XA Unspecified injury of face, initial encounter: Secondary | ICD-10-CM | POA: Diagnosis present

## 2021-11-17 MED ORDER — LIDOCAINE HCL (PF) 1 % IJ SOLN
5.0000 mL | Freq: Once | INTRAMUSCULAR | Status: AC
  Administered 2021-11-17: 5 mL
  Filled 2021-11-17: qty 5

## 2021-11-17 NOTE — ED Notes (Signed)
Lidocaine provided to Dr.Paduchowski
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
## 2021-11-17 NOTE — Discharge Instructions (Signed)
As we discussed your laceration has been repaired with 3 absorbable sutures these do not need to be removed.  You may get the area wet but allow it to air dry do not rub the area as this may rip a stitch.  Please follow-up with your doctor in approximately 1 week for recheck/reevaluation.  Return to the emergency department for any symptom personally concerning to yourself.

## 2021-11-17 NOTE — ED Triage Notes (Signed)
Pt presents to ER from home with laceration to upper lip. No bleeding noted at present. Pt denies any other symptoms at present

## 2022-08-27 ENCOUNTER — Encounter: Payer: Self-pay | Admitting: Emergency Medicine

## 2022-08-27 ENCOUNTER — Other Ambulatory Visit: Payer: Self-pay

## 2022-08-27 ENCOUNTER — Emergency Department: Payer: Medicaid Other

## 2022-08-27 ENCOUNTER — Emergency Department
Admission: EM | Admit: 2022-08-27 | Discharge: 2022-08-27 | Disposition: A | Discharge status: Home / Self Care | Payer: Medicaid Other | Attending: Emergency Medicine | Admitting: Emergency Medicine

## 2022-08-27 DIAGNOSIS — S93491A Sprain of other ligament of right ankle, initial encounter: Secondary | ICD-10-CM | POA: Insufficient documentation

## 2022-08-27 DIAGNOSIS — Y30XXXA Falling, jumping or pushed from a high place, undetermined intent, initial encounter: Secondary | ICD-10-CM | POA: Insufficient documentation

## 2022-08-27 DIAGNOSIS — S99921A Unspecified injury of right foot, initial encounter: Secondary | ICD-10-CM | POA: Diagnosis present

## 2022-08-27 DIAGNOSIS — S93401A Sprain of unspecified ligament of right ankle, initial encounter: Secondary | ICD-10-CM

## 2022-08-27 MED ORDER — OXYCODONE-ACETAMINOPHEN 5-325 MG PO TABS
1.0000 | ORAL_TABLET | Freq: Once | ORAL | Status: AC
  Administered 2022-08-27: 1 via ORAL
  Filled 2022-08-27: qty 1

## 2022-08-27 MED ORDER — OXYCODONE-ACETAMINOPHEN 5-325 MG PO TABS: 1.0000 | ORAL_TABLET | ORAL | 0 refills | Status: AC | PRN

## 2022-08-27 MED ORDER — OXYCODONE-ACETAMINOPHEN 5-325 MG PO TABS
1.0000 | ORAL_TABLET | ORAL | Status: DC | PRN
  Administered 2022-08-27: 1 via ORAL
  Filled 2022-08-27: qty 1

## 2022-08-27 MED ORDER — IBUPROFEN 600 MG PO TABS
600.0000 mg | ORAL_TABLET | Freq: Once | ORAL | Status: AC
  Administered 2022-08-27: 600 mg via ORAL
  Filled 2022-08-27: qty 1

## 2022-08-27 MED ORDER — IBUPROFEN 600 MG PO TABS: 600.0000 mg | ORAL_TABLET | Freq: Three times a day (TID) | ORAL | 0 refills | Status: DC | PRN

## 2022-08-27 NOTE — ED Provider Notes (Signed)
[facility] Provider Note    Event Date/Time   First MD Initiated Contact with Patient 08/27/22 0220     (approximate)   History   Foot Injury   HPI  Dakota Clements is a 37 y.o. male who presents to the ED from home with a chief complaint of right foot and ankle injury.  Patient jumped a fence and fell onto uneven ground into a hole and twisted his ankle.  Denies striking head or LOC.  Complains of pain and swelling to his right foot and ankle.  Voices no other complaints or injuries.     Past Medical History   Past Medical History:  Diagnosis Date   Broken jaw (HCC)    Seizures (HCC)    Small bowel obstruction (HCC)    When he was a baby     Active Problem List  There are no problems to display for this patient.    Past Surgical History   Past Surgical History:  Procedure Laterality Date   MANDIBLE FRACTURE SURGERY       Home Medications   Prior to Admission medications   Medication Sig Start Date End Date Taking? Authorizing Provider  ibuprofen (ADVIL) 600 MG tablet Take 1 tablet (600 mg total) by mouth every 8 (eight) hours as needed. 08/27/22  Yes Irean Hong, MD  oxyCODONE-acetaminophen (PERCOCET/ROXICET) 5-325 MG tablet Take 1 tablet by mouth every 4 (four) hours as needed for severe pain. 08/27/22  Yes Irean Hong, MD  clindamycin (CLEOCIN) 300 MG capsule Take 1 capsule (300 mg total) by mouth 4 (four) times daily. 07/06/18   Cuthriell, Delorise Royals, PA-C  HYDROcodone-acetaminophen (NORCO/VICODIN) 5-325 MG tablet Take 1 tablet by mouth every 6 (six) hours as needed for moderate pain. 07/06/18   Cuthriell, Delorise Royals, PA-C  lidocaine (XYLOCAINE) 2 % solution Use as directed 5 mLs in the mouth or throat every 6 (six) hours as needed for mouth pain. For oral swish. 04/15/19   Joni Reining, PA-C  magic mouthwash w/lidocaine SOLN Take 5 mLs by mouth 4 (four) times daily. 07/06/18   Cuthriell, Delorise Royals, PA-C     Allergies   Dilantin [phenytoin sodium extended]   Family History  History reviewed. No pertinent family history.   Physical Exam  Triage Vital Signs: ED Triage Vitals  Enc Vitals Group     BP 08/27/22 0118 113/78     Pulse Rate 08/27/22 0114 79     Resp 08/27/22 0114 20     Temp 08/27/22 0114 98.1 F (36.7 C)     Temp Source 08/27/22 0114 Oral     SpO2 08/27/22 0114 100 %     Weight 08/27/22 0115 148 lb (67.1 kg)     Height 08/27/22 0115 5\' 11"  (1.803 m)     Head Circumference --      Peak Flow --      Pain Score 08/27/22 0115 10     Pain Loc --      Pain Edu? --      Excl. in [place]? --     Updated Vital Signs: BP 113/78 (BP Location: Left Arm)   Pulse 79   Temp 98.1 F (36.7 C) (Oral)   Resp 20   Ht 5\' 11"  (1.803 m)   Wt 67.1 kg   SpO2 100%   BMI 20.64 kg/m    General: Awake, mild distress.  CV:  RRR.  Good peripheral perfusion.  Resp:  Normal  effort.  CTAB. Abd:  No distention.  Other:  RLE: Mild swelling to medial malleolus.  Moderate swelling to lateral malleolus.  Decreased range of motion secondary to pain.  Tender to palpation dorsal right foot.  2+ distal pulses.  Brisk, less than 5-second cap refill.   ED Results / Procedures / Treatments  Labs (all labs ordered are listed, but only abnormal results are displayed) Labs Reviewed - No data to display   EKG  None   RADIOLOGY I have independently visualized and interpreted patient's x-rays as well as noted the radiology interpretation:  Right ankle and foot x-ray: Soft tissue swelling, no acute osseous injury  Official radiology report(s): DG Ankle Complete Right  Result Date: 08/27/2022 CLINICAL DATA:  Twisting injury while jumping a fence with ankle pain, initial encounter EXAM: RIGHT ANKLE - COMPLETE 3+ VIEW COMPARISON:  None Available. FINDINGS: Lateral soft tissue swelling is noted. No acute fracture or dislocation is seen. IMPRESSION: Soft tissue swelling laterally without acute bony abnormality.  Electronically Signed   By: Alcide Clever M.D.   On: 08/27/2022 01:49   DG Foot Complete Right  Result Date: 08/27/2022 CLINICAL DATA:  Right foot injury while jumping a fence, initial encounter EXAM: RIGHT FOOT COMPLETE - 3+ VIEW COMPARISON:  None Available. FINDINGS: Mild soft tissue swelling is noted along the anterior aspect of the ankle. No acute fracture or dislocation is seen. IMPRESSION: Mild soft tissue swelling without acute bony abnormality. Electronically Signed   By: Alcide Clever M.D.   On: 08/27/2022 01:48     PROCEDURES:  Critical Care performed: No  Procedures   MEDICATIONS ORDERED IN ED: Medications  oxyCODONE-acetaminophen (PERCOCET/ROXICET) 5-325 MG per tablet 1 tablet (1 tablet Oral Given 08/27/22 0129)  ibuprofen (ADVIL) tablet 600 mg (has no administration in time range)  oxyCODONE-acetaminophen (PERCOCET/ROXICET) 5-325 MG per tablet 1 tablet (has no administration in time range)     IMPRESSION / MDM / ASSESSMENT AND PLAN / ED COURSE  I reviewed the triage vital signs and the nursing notes.                             37 year old male presenting with right foot and ankle pain and swelling status post injury.  X-rays negative for acute osseous injury.  Will administer analgesia, placed in OCL stirrup splint, crutches and patient will follow-up with podiatry.  Strict return precautions given.  Patient and spouse verbalized understanding and agree with plan of care.  Patient's presentation is most consistent with acute, uncomplicated illness.   FINAL CLINICAL IMPRESSION(S) / ED DIAGNOSES   Final diagnoses:  Sprain of right ankle, unspecified ligament, initial encounter     Rx / DC Orders   ED Discharge Orders          Ordered    ibuprofen (ADVIL) 600 MG tablet  Every 8 hours PRN        08/27/22 0254    oxyCODONE-acetaminophen (PERCOCET/ROXICET) 5-325 MG tablet  Every 4 hours PRN        08/27/22 0254             Note:  This document was prepared  using Dragon voice recognition software and may include unintentional dictation errors.   Irean Hong, MD 08/27/22 [phone]

## 2022-08-27 NOTE — ED Triage Notes (Addendum)
Pt jumped a fence falling to uneven ground sustaining a fall. C/o right foot pain and ankle pain. Has obvious swelling to right ankle. +ETOH.

## 2022-08-27 NOTE — Discharge Instructions (Signed)
1.  You can take pain medicines as needed (Motrin/Percocet). 2.  Keep splint clean and dry.  Elevate splint several times daily and apply ice over splint to reduce swelling. 3.  Use crutches to help you balance as you walk.  You may bear weight as tolerated. 4.  Return to the ER for worsening symptoms, persistent vomiting, difficulty breathing or other concerns.

## 2022-09-02 ENCOUNTER — Ambulatory Visit: Payer: Medicaid Other | Admitting: Podiatry

## 2022-09-18 ENCOUNTER — Ambulatory Visit: Payer: Medicaid Other | Admitting: Podiatry

## 2023-12-15 ENCOUNTER — Emergency Department
Admission: EM | Admit: 2023-12-15 | Discharge: 2023-12-15 | Disposition: A | Discharge status: Home / Self Care | Attending: Emergency Medicine | Admitting: Emergency Medicine

## 2023-12-15 ENCOUNTER — Other Ambulatory Visit: Payer: Self-pay

## 2023-12-15 DIAGNOSIS — S40811A Abrasion of right upper arm, initial encounter: Secondary | ICD-10-CM | POA: Diagnosis not present

## 2023-12-15 DIAGNOSIS — S0181XA Laceration without foreign body of other part of head, initial encounter: Secondary | ICD-10-CM | POA: Insufficient documentation

## 2023-12-15 MED ORDER — IBUPROFEN 600 MG PO TABS: 600.0000 mg | ORAL_TABLET | Freq: Three times a day (TID) | ORAL | 0 refills | Status: AC | PRN

## 2023-12-15 NOTE — Discharge Instructions (Addendum)
 Please use ibuprofen (Motrin) up to 800 mg every 8 hours, naproxen (Naprosyn) up to 500 mg every 12 hours, and/or acetaminophen (Tylenol) up to 4 g/day for any continued pain.  Please do not use this medication regimen for longer than 7 days
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
